# Patient Record
Sex: Female | Born: 1950
Health system: Southern US, Community
[De-identification: ages and names within clinical notes are randomized; demographics above are authoritative.]

## PROBLEM LIST (undated history)

## (undated) DIAGNOSIS — M5020 Other cervical disc displacement, unspecified cervical region: Secondary | ICD-10-CM

## (undated) DIAGNOSIS — F32A Depression, unspecified: Secondary | ICD-10-CM

## (undated) DIAGNOSIS — R112 Nausea with vomiting, unspecified: Secondary | ICD-10-CM

## (undated) DIAGNOSIS — M1990 Unspecified osteoarthritis, unspecified site: Secondary | ICD-10-CM

## (undated) DIAGNOSIS — Z9889 Other specified postprocedural states: Secondary | ICD-10-CM

## (undated) DIAGNOSIS — T4145XA Adverse effect of unspecified anesthetic, initial encounter: Secondary | ICD-10-CM

## (undated) DIAGNOSIS — M4802 Spinal stenosis, cervical region: Secondary | ICD-10-CM

## (undated) DIAGNOSIS — E079 Disorder of thyroid, unspecified: Secondary | ICD-10-CM

## (undated) DIAGNOSIS — T8859XA Other complications of anesthesia, initial encounter: Secondary | ICD-10-CM

## (undated) DIAGNOSIS — F419 Anxiety disorder, unspecified: Secondary | ICD-10-CM

## (undated) DIAGNOSIS — K219 Gastro-esophageal reflux disease without esophagitis: Secondary | ICD-10-CM

## (undated) DIAGNOSIS — F329 Major depressive disorder, single episode, unspecified: Secondary | ICD-10-CM

## (undated) HISTORY — DX: Disorder of thyroid, unspecified: E07.9

## (undated) HISTORY — DX: Depression, unspecified: F32.A

## (undated) HISTORY — DX: Major depressive disorder, single episode, unspecified: F32.9

## (undated) HISTORY — PX: FOOT SURGERY: SHX648

## (undated) HISTORY — PX: TUBAL LIGATION: SHX77

## (undated) HISTORY — DX: Anxiety disorder, unspecified: F41.9

## (undated) HISTORY — PX: BREAST CYST ASPIRATION: SHX578

## (undated) HISTORY — PX: CARPAL TUNNEL RELEASE: SHX101

---

## 2007-11-03 HISTORY — PX: BRAIN TUMOR EXCISION: SHX577

## 2012-08-11 DIAGNOSIS — Z79890 Hormone replacement therapy (postmenopausal): Secondary | ICD-10-CM | POA: Insufficient documentation

## 2015-06-12 DIAGNOSIS — M654 Radial styloid tenosynovitis [de Quervain]: Secondary | ICD-10-CM | POA: Insufficient documentation

## 2015-11-03 HISTORY — PX: BACK SURGERY: SHX140

## 2018-03-21 DIAGNOSIS — H43812 Vitreous degeneration, left eye: Secondary | ICD-10-CM | POA: Diagnosis not present

## 2018-03-21 DIAGNOSIS — H43393 Other vitreous opacities, bilateral: Secondary | ICD-10-CM | POA: Diagnosis not present

## 2018-03-21 DIAGNOSIS — H4312 Vitreous hemorrhage, left eye: Secondary | ICD-10-CM | POA: Diagnosis not present

## 2018-03-21 DIAGNOSIS — H47093 Other disorders of optic nerve, not elsewhere classified, bilateral: Secondary | ICD-10-CM | POA: Diagnosis not present

## 2018-03-21 DIAGNOSIS — H25813 Combined forms of age-related cataract, bilateral: Secondary | ICD-10-CM | POA: Diagnosis not present

## 2018-03-21 DIAGNOSIS — H2513 Age-related nuclear cataract, bilateral: Secondary | ICD-10-CM | POA: Diagnosis not present

## 2018-03-21 DIAGNOSIS — H43392 Other vitreous opacities, left eye: Secondary | ICD-10-CM | POA: Diagnosis not present

## 2018-03-21 DIAGNOSIS — H4423 Degenerative myopia, bilateral: Secondary | ICD-10-CM | POA: Diagnosis not present

## 2018-03-21 DIAGNOSIS — H25013 Cortical age-related cataract, bilateral: Secondary | ICD-10-CM | POA: Diagnosis not present

## 2018-03-21 DIAGNOSIS — H43813 Vitreous degeneration, bilateral: Secondary | ICD-10-CM | POA: Diagnosis not present

## 2018-03-24 ENCOUNTER — Encounter (INDEPENDENT_AMBULATORY_CARE_PROVIDER_SITE_OTHER): Payer: Self-pay | Admitting: Orthopaedic Surgery

## 2018-03-24 ENCOUNTER — Ambulatory Visit (INDEPENDENT_AMBULATORY_CARE_PROVIDER_SITE_OTHER): Payer: Medicare Other | Admitting: Orthopaedic Surgery

## 2018-03-24 VITALS — BP 141/74 | HR 104 | Ht 63.0 in | Wt 170.0 lb

## 2018-03-24 DIAGNOSIS — M47812 Spondylosis without myelopathy or radiculopathy, cervical region: Secondary | ICD-10-CM

## 2018-03-24 DIAGNOSIS — M7542 Impingement syndrome of left shoulder: Secondary | ICD-10-CM | POA: Diagnosis not present

## 2018-03-24 DIAGNOSIS — Z9889 Other specified postprocedural states: Secondary | ICD-10-CM | POA: Diagnosis not present

## 2018-03-24 NOTE — Progress Notes (Signed)
Office Visit Note   Patient: Elizabeth Hull           Date of Birth: Feb 03, 1951           MRN: [id number] Visit Date: 03/24/2018              Requested by: No referring provider defined for this encounter. PCP: No primary care provider on file.   Assessment & Plan: Visit Diagnoses:  1. Impingement syndrome of left shoulder   2. Spondylosis without myelopathy or radiculopathy, cervical region   3. S/P lumbar microdiscectomy     Plan: Left subacromial injection performed with improvement in pain.  She like to come back next week to consider injection right shoulder.  Will obtain 2 view x-rays right and left shoulder on return visit.  Follow-Up Instructions: Return in about 1 week (around 03/31/2018).   Orders:  Orders Placed This Encounter  Procedures  . Large Joint Inj: L subacromial bursa   No orders of the defined types were placed in this encounter.     Procedures: Large Joint Inj: L subacromial bursa on 03/24/2018 3:35 PM Indications: pain Details: 22 G 1.5 in needle  Arthrogram: No  Medications: 4 mL bupivacaine 0.25 %; 40 mg methylPREDNISolone acetate 40 MG/ML; 0.5 mL lidocaine 1 % Outcome: tolerated well, no immediate complications Procedure, treatment alternatives, risks and benefits explained, specific risks discussed. Consent was given by the patient. Immediately prior to procedure a time out was called to verify the correct patient, procedure, equipment, support staff and site/side marked as required. Patient was prepped and draped in the usual sterile fashion.       Clinical Data: No additional findings.   Subjective: Chief Complaint  Patient presents with  . Neck - Pain    HPI 67 year old female first visit with bilateral shoulder pain.  She can reach behind her back with either arm she cannot sleep on either side.  She is at history of lumbar surgery 2017 has known disc degeneration in the cervical spine.  She is had pain for years and states is been  worse in the last 2 months.  She is been treated with gabapentin Aleve, icy hot, ice packs and heating pad all without relief.  She does have a history of benign brain tumor the past.  Was treated at [facility] in [place] with her previous lumbar surgery.  Brain tumor surgery 2009.  Cervical MRI scan done at [facility] showed multilevel cervical degenerative changes with neuroforaminal narrowing at C4-5 worse on the right.  Disc osteophyte mild right and mild to moderate left at C5-6.  Disc osteophyte complex severe to the left moderate to severe left and mild right neuroforaminal narrowing.  Incidental note is right temporal lobe encephalomalacia visualized on sagittal image likely related to previous brain surgery.  Plain radiographs showed multilevel degenerative changes from C4-C7.   Review of Systems positive for anxiety ,depression ,thyroid condition.  Previous benign brain tumor resection 2009 lumbar surgery 2017.   Objective: Vital Signs: BP (!) 141/74   Pulse (!) 104   Ht  (1.6 m)   Wt 170 lb (77.1 kg)   BMI 30.11 kg/m   Physical Exam  Constitutional: She is oriented to person, place, and time. She appears well-developed.  HENT:  Head: Normocephalic.  Right Ear: External ear normal.  Left Ear: External ear normal.  Eyes: Pupils are equal, round, and reactive to light.  Neck: No tracheal deviation present. No thyromegaly present.  Cardiovascular: Normal  rate.  Pulmonary/Chest: Effort normal.  Abdominal: Soft.  Neurological: She is alert and oriented to person, place, and time.  Skin: Skin is warm and dry.  Psychiatric: She has a normal mood and affect. Her behavior is normal.  Healed craniotomy scar.  Ortho Exam healed lumbar incision.  Bilateral shoulder impingement .mild to moderate brachial plexus tenderness right and left.  Upper extremity reflexes are 2+ and symmetrical.  No wrist extension and flexion biceps triceps weakness.  Specialty  Comments:  No specialty comments available.  Imaging: No results found.   PMFS History: Patient Active Problem List   Diagnosis Date Noted  . Spondylosis without myelopathy or radiculopathy, cervical region 03/29/2018   Past Medical History:  Diagnosis Date  . Anxiety   . Depression   . Thyroid condition     No family history on file.  Past Surgical History:  Procedure Laterality Date  . BACK SURGERY  2017   lumbar  . BRAIN TUMOR EXCISION     Social History   Occupational History  . Not on file  Tobacco Use  . Smoking status: Never Smoker  . Smokeless tobacco: Never Used  Substance and Sexual Activity  . Alcohol use: Yes    Comment: wine with dinner  . Drug use: Never  . Sexual activity: Not on file

## 2018-03-29 ENCOUNTER — Encounter (INDEPENDENT_AMBULATORY_CARE_PROVIDER_SITE_OTHER): Payer: Self-pay | Admitting: Orthopaedic Surgery

## 2018-03-29 DIAGNOSIS — M47812 Spondylosis without myelopathy or radiculopathy, cervical region: Secondary | ICD-10-CM | POA: Insufficient documentation

## 2018-03-29 DIAGNOSIS — Z9889 Other specified postprocedural states: Secondary | ICD-10-CM | POA: Insufficient documentation

## 2018-03-29 DIAGNOSIS — M5030 Other cervical disc degeneration, unspecified cervical region: Secondary | ICD-10-CM | POA: Insufficient documentation

## 2018-03-29 DIAGNOSIS — M7542 Impingement syndrome of left shoulder: Secondary | ICD-10-CM | POA: Insufficient documentation

## 2018-03-29 MED ORDER — METHYLPREDNISOLONE ACETATE 40 MG/ML IJ SUSP
40.0000 mg | INTRAMUSCULAR | Status: AC | PRN
Start: 2018-03-24 — End: 2018-03-24
  Administered 2018-03-24: 40 mg via INTRA_ARTICULAR

## 2018-03-29 MED ORDER — BUPIVACAINE HCL 0.25 % IJ SOLN
4.0000 mL | INTRAMUSCULAR | Status: AC | PRN
Start: 2018-03-24 — End: 2018-03-24
  Administered 2018-03-24: 4 mL via INTRA_ARTICULAR

## 2018-03-29 MED ORDER — LIDOCAINE HCL 1 % IJ SOLN
0.5000 mL | INTRAMUSCULAR | Status: AC | PRN
  Administered 2018-03-24: .5 mL

## 2018-04-04 DIAGNOSIS — H43393 Other vitreous opacities, bilateral: Secondary | ICD-10-CM | POA: Diagnosis not present

## 2018-04-04 DIAGNOSIS — H4423 Degenerative myopia, bilateral: Secondary | ICD-10-CM | POA: Diagnosis not present

## 2018-04-04 DIAGNOSIS — H4312 Vitreous hemorrhage, left eye: Secondary | ICD-10-CM | POA: Diagnosis not present

## 2018-04-04 DIAGNOSIS — H43813 Vitreous degeneration, bilateral: Secondary | ICD-10-CM | POA: Diagnosis not present

## 2018-04-07 ENCOUNTER — Ambulatory Visit (INDEPENDENT_AMBULATORY_CARE_PROVIDER_SITE_OTHER): Payer: Medicare Other | Admitting: Orthopaedic Surgery

## 2018-04-13 DIAGNOSIS — Z6829 Body mass index (BMI) 29.0-29.9, adult: Secondary | ICD-10-CM | POA: Diagnosis not present

## 2018-04-13 DIAGNOSIS — N951 Menopausal and female climacteric states: Secondary | ICD-10-CM | POA: Diagnosis not present

## 2018-04-21 ENCOUNTER — Ambulatory Visit (INDEPENDENT_AMBULATORY_CARE_PROVIDER_SITE_OTHER): Payer: Medicare Other | Admitting: Orthopaedic Surgery

## 2018-04-21 ENCOUNTER — Ambulatory Visit (INDEPENDENT_AMBULATORY_CARE_PROVIDER_SITE_OTHER): Payer: Medicare Other

## 2018-04-21 ENCOUNTER — Ambulatory Visit (INDEPENDENT_AMBULATORY_CARE_PROVIDER_SITE_OTHER): Payer: Self-pay

## 2018-04-21 ENCOUNTER — Encounter (INDEPENDENT_AMBULATORY_CARE_PROVIDER_SITE_OTHER): Payer: Self-pay | Admitting: Orthopaedic Surgery

## 2018-04-21 VITALS — BP 140/83 | HR 97 | Ht 63.0 in | Wt 165.0 lb

## 2018-04-21 DIAGNOSIS — M25511 Pain in right shoulder: Secondary | ICD-10-CM

## 2018-04-21 DIAGNOSIS — G8929 Other chronic pain: Secondary | ICD-10-CM | POA: Diagnosis not present

## 2018-04-21 DIAGNOSIS — M25512 Pain in left shoulder: Secondary | ICD-10-CM

## 2018-04-21 DIAGNOSIS — M47812 Spondylosis without myelopathy or radiculopathy, cervical region: Secondary | ICD-10-CM | POA: Diagnosis not present

## 2018-04-21 DIAGNOSIS — M19012 Primary osteoarthritis, left shoulder: Secondary | ICD-10-CM

## 2018-04-21 DIAGNOSIS — M19011 Primary osteoarthritis, right shoulder: Secondary | ICD-10-CM

## 2018-04-21 NOTE — Progress Notes (Signed)
Office Visit Note   Patient: Elizabeth Hull           Date of Birth: Oct 21, 1951           MRN: [id number] Visit Date: 04/21/2018              Requested by: No referring provider defined for this encounter. PCP: No primary care provider on file.   Assessment & Plan: Visit Diagnoses:  1. Spondylosis without myelopathy or radiculopathy, cervical region   2. Chronic pain of both shoulders     Plan: Patient's cervical spondylosis is giving her the most problems.  Will obtain a new cervical MRI scan for comparison to the MRI 2 years ago aggressive neck and arm symptoms.  Office follow-up after scan for review.  We reviewed her previous images from 2 years ago which showed the retrolisthesis and significant narrowing.  Follow-Up Instructions: No follow-ups on file.   Orders:  Orders Placed This Encounter  Procedures  . XR Cervical Spine 2 or 3 views  . XR Shoulder Right  . XR Shoulder Left   No orders of the defined types were placed in this encounter.     Procedures: No procedures performed   Clinical Data: No additional findings.   Subjective: Chief Complaint  Patient presents with  . Neck - Pain, Follow-up  . Right Shoulder - Pain, Follow-up  . Left Shoulder - Pain, Follow-up    HPI 67-year-old female returns with ongoing problems with chronic neck pain.  Previous MRI 2 years ago showed severe spondylosis C4-5, C5-6, C6-7.  She has retrolisthesis at C4-5 and C5-6 on plain radiographs and significant uncovertebral and facet arthropathy.  Previous lumbar microdiscectomy is doing well.  She still has some mild back aching.  She has chronic pain in both shoulders with inability to reach past the posterior axillary line and subacromial injection right shoulder on 03/24/2018 did not give her any relief.  She gets some improvement with ibuprofen.  She is had significant [place] pain that radiates into her shoulders pain with rotation  extension and flexion of her neck.  Review of Systems positive for previous microdiscectomy.  Bilateral shoulder moderate osteoarthritis.  Right paralumbar approach likely at the L4-5 level for her microdiscectomy 2017.   Objective: Vital Signs: BP 140/83   Pulse 97   Ht 5\' 3"  (1.6 m)   Wt 165 lb (74.8 kg)   BMI 29.23 kg/m   Physical Exam  Constitutional: She is oriented to person, place, and time. She appears well-developed.  HENT:  Head: Normocephalic.  Right Ear: External ear normal.  Left Ear: External ear normal.  Eyes: Pupils are equal, round, and reactive to light.  Neck: No tracheal deviation present. No thyromegaly present.  Cardiovascular: Normal rate.  Pulmonary/Chest: Effort normal.  Abdominal: Soft.  Neurological: She is alert and oriented to person, place, and time.  Skin: Skin is warm and dry.  Psychiatric: She has a normal mood and affect. Her behavior is normal.    Ortho Exam lateral brachial plexus tenderness 50% flexion extension with reproduction of her pain with extension.  Negative drop arm test positive impingement limitation of internal rotation with hand to the mid to posterior axillary line right and left.  No supraclavicular lymphadenopathy.  Positive brachial plexus tenderness positive Spurling right and left.  Lower extremity reflexes are intact normal heel  heel toe gait.  Specialty Comments:  No specialty comments available.  Imaging: No results  found.   PMFS History: Patient Active Problem List   Diagnosis Date Noted  . Spondylosis without myelopathy or radiculopathy, cervical region 03/29/2018  . S/P lumbar microdiscectomy 03/29/2018  . Impingement syndrome of left shoulder 03/29/2018   Past Medical History:  Diagnosis Date  . Anxiety   . Depression   . Thyroid condition     No family history on file.  Past Surgical History:  Procedure Laterality Date  . BACK SURGERY  2017   lumbar  . BRAIN TUMOR EXCISION     Social History    Occupational History  . Not on file  Tobacco Use  . Smoking status: Never Smoker  . Smokeless tobacco: Never Used  Substance and Sexual Activity  . Alcohol use: Yes    Comment: wine with dinner  . Drug use: Never  . Sexual activity: Not on file

## 2018-04-25 DIAGNOSIS — M4802 Spinal stenosis, cervical region: Secondary | ICD-10-CM | POA: Diagnosis not present

## 2018-04-25 DIAGNOSIS — M4722 Other spondylosis with radiculopathy, cervical region: Secondary | ICD-10-CM | POA: Diagnosis not present

## 2018-04-25 DIAGNOSIS — M542 Cervicalgia: Secondary | ICD-10-CM | POA: Diagnosis not present

## 2018-04-27 ENCOUNTER — Encounter (INDEPENDENT_AMBULATORY_CARE_PROVIDER_SITE_OTHER): Payer: Self-pay | Admitting: Orthopaedic Surgery

## 2018-04-27 DIAGNOSIS — M19011 Primary osteoarthritis, right shoulder: Secondary | ICD-10-CM | POA: Insufficient documentation

## 2018-04-28 ENCOUNTER — Ambulatory Visit (INDEPENDENT_AMBULATORY_CARE_PROVIDER_SITE_OTHER): Payer: Medicare Other | Admitting: Orthopaedic Surgery

## 2018-04-28 ENCOUNTER — Encounter (INDEPENDENT_AMBULATORY_CARE_PROVIDER_SITE_OTHER): Payer: Self-pay | Admitting: Orthopaedic Surgery

## 2018-04-28 VITALS — BP 137/80 | HR 98 | Ht 63.0 in | Wt 165.0 lb

## 2018-04-28 DIAGNOSIS — M19011 Primary osteoarthritis, right shoulder: Secondary | ICD-10-CM | POA: Diagnosis not present

## 2018-04-28 DIAGNOSIS — M47812 Spondylosis without myelopathy or radiculopathy, cervical region: Secondary | ICD-10-CM

## 2018-04-28 DIAGNOSIS — M2550 Pain in unspecified joint: Secondary | ICD-10-CM | POA: Diagnosis not present

## 2018-04-28 NOTE — Progress Notes (Signed)
Office Visit Note   Patient: Elizabeth Hull           Date of Birth: Mar 19, 1951           MRN: [id number] Visit Date: 04/28/2018              Requested by: No referring provider defined for this encounter. PCP: No primary care provider on file.   Assessment & Plan: Visit Diagnoses:  1. Multiple joint pain   2. Spondylosis without myelopathy or radiculopathy, cervical region   3. Primary osteoarthritis, right shoulder     Plan: We will obtain an arthritis panel.  Intra-articular right posterior shoulder injection performed which she tolerated well.  We will check her back in 2 weeks for follow-up.  We reviewed the MRI scan today I gave her a copy of the report she understands that she has multilevel involvement some on the right and some on the left.  In her cervical spine the left foramina at C3-4 and right foramen at C4-5 are most significant levels.  Mild central narrowing at 5 6 and C6-7 but no severe stenosis at those levels.  Follow-Up Instructions: Return in about 2 weeks (around 05/12/2018).   Orders:  Orders Placed This Encounter  Procedures  . CBC with Differential  . Uric acid  . Sed Rate (ESR)  . Antinuclear Antib (ANA)  . Rheumatoid Factor   No orders of the defined types were placed in this encounter.     Procedures: Large Joint Inj: R glenohumeral on 04/29/2018 5:04 PM Indications: pain Details: 22 G 1.5 in needle  Arthrogram: No  Medications: 4 mL bupivacaine 0.25 %; 40 mg methylPREDNISolone acetate 40 MG/ML; 0.5 mL lidocaine 1 % Outcome: tolerated well, no immediate complications Procedure, treatment alternatives, risks and benefits explained, specific risks discussed. Consent was given by the patient. Immediately prior to procedure a time out was called to verify the correct patient, procedure, equipment, support staff and site/side marked as required. Patient was prepped and draped in the usual sterile fashion.       Clinical Data: No additional  findings.   Subjective: Chief Complaint  Patient presents with  . Neck - Pain, Follow-up    MRI C-spine review    HPI 67 year old female returns for follow-up of cervical spondylosis as well as osteoarthritis of the shoulders.  Cervical MRI scan has been obtained for review and comparison to previous MRI 2 years ago.  Next been giving her more problems.  Previous lumbar microdiscectomy doing well.  He has problems reaching behind her posterior axillary line with both the right and left shoulder.  No relief with 03/24/2018 subacromial injection right shoulder.  Both shoulders show moderate osteoarthritis.  Review of Systems 14 point review of systems updated unchanged from last week office visit 04/21/2018.  Of note his depression, anxiety, thyroid condition, benign brain tumor resection 2009, lumbar microdiscectomy 2017.   Objective: Vital Signs: BP 137/80   Pulse 98   Ht _0  (1.6 m)   Wt 165 lb (74.8 kg)   BMI 29.23 kg/m   Physical Exam  Constitutional: She is oriented to person, place, and time. She appears well-developed.  HENT:  Head: Normocephalic.  Right Ear: External ear normal.  Left Ear: External ear normal.  Eyes: Pupils are equal, round, and reactive to light.  Neck: No tracheal deviation present. No thyromegaly present.  Cardiovascular: Normal rate.  Pulmonary/Chest: Effort normal.  Abdominal: Soft.  Neurological: She is alert and oriented to person, place,  and time.  Skin: Skin is warm and dry.  Psychiatric: She has a normal mood and affect. Her behavior is normal.    Ortho Exam patient has increased pain with cervical compression mild improvement with distraction.  Positive impingement both shoulders.  Limitation of range of motion both shoulders unchanged from 04/21/2018 visit.  Positive brachial plexus tenderness.  Specialty Comments:  No specialty comments available.  Imaging: Cervical MRI scan 04/25/2018 shows moderate to severe left foraminal stenosis at  C3-4.  C4-5 shows mild central stenosis and severe right foraminal stenosis due to large osteophyte.  Mild central stenosis at C5-6 and mild central stenosis at C6-7 with moderate foraminal stenosis greater on the right than the left with spurring.   PMFS History: Patient Active Problem List   Diagnosis Date Noted  . Primary osteoarthritis, right shoulder 04/27/2018  . Spondylosis without myelopathy or radiculopathy, cervical region 03/29/2018  . S/P lumbar microdiscectomy 03/29/2018  . Impingement syndrome of left shoulder 03/29/2018   Past Medical History:  Diagnosis Date  . Anxiety   . Depression   . Thyroid condition     History reviewed. No pertinent family history.  Past Surgical History:  Procedure Laterality Date  . BACK SURGERY  2017   lumbar  . BRAIN TUMOR EXCISION     Social History   Occupational History  . Not on file  Tobacco Use  . Smoking status: Never Smoker  . Smokeless tobacco: Never Used  Substance and Sexual Activity  . Alcohol use: Yes    Comment: wine with dinner  . Drug use: Never  . Sexual activity: Not on file

## 2018-04-29 ENCOUNTER — Encounter (INDEPENDENT_AMBULATORY_CARE_PROVIDER_SITE_OTHER): Payer: Self-pay | Admitting: Orthopaedic Surgery

## 2018-04-29 DIAGNOSIS — M19011 Primary osteoarthritis, right shoulder: Secondary | ICD-10-CM

## 2018-04-29 DIAGNOSIS — M2550 Pain in unspecified joint: Secondary | ICD-10-CM | POA: Diagnosis not present

## 2018-04-29 MED ORDER — BUPIVACAINE HCL 0.25 % IJ SOLN
4.0000 mL | INTRAMUSCULAR | Status: AC | PRN
  Administered 2018-04-29: 4 mL via INTRA_ARTICULAR

## 2018-04-29 MED ORDER — METHYLPREDNISOLONE ACETATE 40 MG/ML IJ SUSP
40.0000 mg | INTRAMUSCULAR | Status: AC | PRN
  Administered 2018-04-29: 40 mg via INTRA_ARTICULAR

## 2018-04-29 MED ORDER — LIDOCAINE HCL 1 % IJ SOLN
0.5000 mL | INTRAMUSCULAR | Status: AC | PRN
  Administered 2018-04-29: .5 mL

## 2018-05-12 ENCOUNTER — Ambulatory Visit (INDEPENDENT_AMBULATORY_CARE_PROVIDER_SITE_OTHER): Payer: Medicare Other | Admitting: Orthopaedic Surgery

## 2018-05-12 ENCOUNTER — Encounter (INDEPENDENT_AMBULATORY_CARE_PROVIDER_SITE_OTHER): Payer: Self-pay | Admitting: Orthopaedic Surgery

## 2018-05-12 VITALS — BP 145/83 | HR 105 | Ht 63.0 in | Wt 165.0 lb

## 2018-05-12 DIAGNOSIS — M19011 Primary osteoarthritis, right shoulder: Secondary | ICD-10-CM | POA: Diagnosis not present

## 2018-05-12 DIAGNOSIS — M47812 Spondylosis without myelopathy or radiculopathy, cervical region: Secondary | ICD-10-CM | POA: Diagnosis not present

## 2018-05-12 NOTE — Progress Notes (Signed)
Office Visit Note   Patient: Elizabeth Hull           Date of Birth: July 18, 1951           MRN: [id number] Visit Date: 05/12/2018              Requested by: No referring provider defined for this encounter. PCP: Patient, No Pcp Per   Assessment & Plan: Visit Diagnoses:  1. Spondylosis without myelopathy or radiculopathy, cervical region   2. Primary osteoarthritis, right shoulder     Plan: We reviewed the MRI in comparison to the 2 previous MRIs of her cervical spine.  She has multilevel involvement in the cervical spine.  Her primary pain generator appears to be cervical spine and not her shoulder osteoarthritis.  She has been on Neurontin as well as amitriptyline.  She takes trazodone at night.  MRI was reviewed with patient we discussed surgical options.  Plan to recheck her again in 6 weeks.  Follow-Up Instructions: Return in about 6 weeks (around 06/23/2018).   Orders:  No orders of the defined types were placed in this encounter.  No orders of the defined types were placed in this encounter.     Procedures: No procedures performed   Clinical Data: No additional findings.   Subjective: Chief Complaint  Patient presents with  . Neck - Pain, Follow-up  . Right Shoulder - Pain, Follow-up  . Left Shoulder - Pain, Follow-up    HPI 67 year old female returns for follow-up of bilateral shoulder pain as well as cervical spondylosis.  Previous posterior intra-articular shoulder injection done on the right shoulder 04/28/2018 and states she really did not notice any significant change with this injection.  MRI scan 04/25/2018 showed moderate to severe left foraminal stenosis at C3-4.  C4-5 mild central stenosis and severe right foraminal stenosis due to large osteophyte.  Mild central stenosis at C5-6 and mild central stenosis at C6-7 with moderate foraminal stenosis greater on the right than left with spurring.  Shoulder x-rays showed primary osteoarthritis of her shoulder.  Lab  work reviewed with patient shows negative ANA.  Uric acid was 3.8.  WBC was 11.6 with normal differential.  Sed rate was 29 in normal range.  Review of Systems 14 point review of systems updated unchanged from 04/21/2018 office visit.  She did have a benign brain tumor resection, lumbar microdiscectomy 2017.  Positive for depression anxiety and thyroid condition.   Objective: Vital Signs: BP (!) 145/83   Pulse (!) 105   Ht 5\' 3"  (1.6 m)   Wt 165 lb (74.8 kg)   BMI 29.23 kg/m   Physical Exam  Constitutional: She is oriented to person, place, and time. She appears well-developed.  HENT:  Head: Normocephalic.  Right Ear: External ear normal.  Left Ear: External ear normal.  Eyes: Pupils are equal, round, and reactive to light.  Neck: No tracheal deviation present. No thyromegaly present.  Cardiovascular: Normal rate.  Pulmonary/Chest: Effort normal.  Abdominal: Soft.  Neurological: She is alert and oriented to person, place, and time.  Skin: Skin is warm and dry.  Psychiatric: She has a normal mood and affect. Her behavior is normal.    Ortho Exam patient has increased pain with cervical compression some mild improvement with distraction.  Positive impingement both shoulders with limited range of motion unchanged from 620/2019 visit.  Right and left positive brachial plexus tenderness. Specialty Comments:  No specialty comments available.  Imaging: No results found.   PMFS History: Patient  Active Problem List   Diagnosis Date Noted  . Primary osteoarthritis, right shoulder 04/27/2018  . Spondylosis without myelopathy or radiculopathy, cervical region 03/29/2018  . S/P lumbar microdiscectomy 03/29/2018  . Impingement syndrome of left shoulder 03/29/2018   Past Medical History:  Diagnosis Date  . Anxiety   . Depression   . Thyroid condition     No family history on file.  Past Surgical History:  Procedure Laterality Date  . BACK SURGERY  2017   lumbar  . BRAIN TUMOR  EXCISION     Social History   Occupational History  . Not on file  Tobacco Use  . Smoking status: Never Smoker  . Smokeless tobacco: Never Used  Substance and Sexual Activity  . Alcohol use: Yes    Comment: wine with dinner  . Drug use: Never  . Sexual activity: Not on file

## 2018-05-17 ENCOUNTER — Encounter (INDEPENDENT_AMBULATORY_CARE_PROVIDER_SITE_OTHER): Payer: Self-pay | Admitting: Orthopaedic Surgery

## 2018-05-23 DIAGNOSIS — H25813 Combined forms of age-related cataract, bilateral: Secondary | ICD-10-CM | POA: Diagnosis not present

## 2018-05-23 DIAGNOSIS — H25013 Cortical age-related cataract, bilateral: Secondary | ICD-10-CM | POA: Diagnosis not present

## 2018-05-23 DIAGNOSIS — H43812 Vitreous degeneration, left eye: Secondary | ICD-10-CM | POA: Diagnosis not present

## 2018-05-23 DIAGNOSIS — H25041 Posterior subcapsular polar age-related cataract, right eye: Secondary | ICD-10-CM | POA: Diagnosis not present

## 2018-05-23 DIAGNOSIS — H47393 Other disorders of optic disc, bilateral: Secondary | ICD-10-CM | POA: Diagnosis not present

## 2018-05-23 DIAGNOSIS — H524 Presbyopia: Secondary | ICD-10-CM | POA: Diagnosis not present

## 2018-05-23 DIAGNOSIS — H43811 Vitreous degeneration, right eye: Secondary | ICD-10-CM | POA: Diagnosis not present

## 2018-05-23 DIAGNOSIS — H2513 Age-related nuclear cataract, bilateral: Secondary | ICD-10-CM | POA: Diagnosis not present

## 2018-05-23 DIAGNOSIS — H43392 Other vitreous opacities, left eye: Secondary | ICD-10-CM | POA: Diagnosis not present

## 2018-05-30 DIAGNOSIS — Z6830 Body mass index (BMI) 30.0-30.9, adult: Secondary | ICD-10-CM | POA: Diagnosis not present

## 2018-05-30 DIAGNOSIS — N951 Menopausal and female climacteric states: Secondary | ICD-10-CM | POA: Diagnosis not present

## 2018-06-30 ENCOUNTER — Encounter (INDEPENDENT_AMBULATORY_CARE_PROVIDER_SITE_OTHER): Payer: Self-pay | Admitting: Orthopaedic Surgery

## 2018-06-30 ENCOUNTER — Ambulatory Visit (INDEPENDENT_AMBULATORY_CARE_PROVIDER_SITE_OTHER): Payer: Medicare Other | Admitting: Orthopaedic Surgery

## 2018-06-30 VITALS — BP 128/81 | HR 103 | Ht 63.0 in | Wt 170.0 lb

## 2018-06-30 DIAGNOSIS — M47812 Spondylosis without myelopathy or radiculopathy, cervical region: Secondary | ICD-10-CM | POA: Diagnosis not present

## 2018-06-30 DIAGNOSIS — M19011 Primary osteoarthritis, right shoulder: Secondary | ICD-10-CM | POA: Diagnosis not present

## 2018-06-30 NOTE — Progress Notes (Signed)
Office Visit Note   Patient: Elizabeth Hull           Date of Birth: 06/12/1951           MRN: [id number] Visit Date: 06/30/2018              Requested by: No referring provider defined for this encounter. PCP: Patient, No Pcp Per   Assessment & Plan: Visit Diagnoses:  1. Spondylosis without myelopathy or radiculopathy, cervical region   2. Primary osteoarthritis, right shoulder     Plan: We reviewed the images with the patient and her husband cervical spine MRI.  She continues to have neck and right shoulder pain.  We will set her up for single cervical epidural steroid see her back after the injection.  Follow-Up Instructions: Return in about 4 weeks (around 07/28/2018).   Orders:  No orders of the defined types were placed in this encounter.  No orders of the defined types were placed in this encounter.     Procedures: No procedures performed   Clinical Data: No additional findings.   Subjective: Chief Complaint  Patient presents with  . Neck - Pain, Follow-up    HPI 67 year old female returns and is here with her husband.  She continues to have neck pain right shoulder pain.  She has moderate to severe shoulder arthritis but can still get her arm up overhead.  She modifies her activity with her shoulder.  She has pain with rotation of her neck and pain that radiates more in the right shoulder than left radiates down to the elbow and stops is not associated with shoulder range of motion.  She has associated numbness worse on the right than left side.  Intra-articular injection in her shoulder really did not give her any improvement.  She is been on amitriptyline, trazodone, Neurontin, therapy.  Rheumatoid panel was negative. Her neck pain bothers her on a daily basis.  She has trouble falling asleep at night due to her neck pain.  C4-5 is her most severe level with mild central stenosis and severe right foraminal stenosis. Review of Systems 14 point review of systems  updated unchanged from 05/12/2018 office visit other than as mentioned above.  Objective: Vital Signs: BP 128/81   Pulse (!) 103   Ht 5\' 3"  (1.6 m)   Wt 170 lb (77.1 kg)   BMI 30.11 kg/m   Physical Exam  Constitutional: She is oriented to person, place, and time. She appears well-developed.  HENT:  Head: Normocephalic.  Right Ear: External ear normal.  Left Ear: External ear normal.  Eyes: Pupils are equal, round, and reactive to light.  Neck: No tracheal deviation present. No thyromegaly present.  Cardiovascular: Normal rate.  Pulmonary/Chest: Effort normal.  Abdominal: Soft.  Neurological: She is alert and oriented to person, place, and time.  Skin: Skin is warm and dry.  Psychiatric: She has a normal mood and affect. Her behavior is normal.    Ortho Exam patient has pain and crepitus with right shoulder range of motion but is able to get her arm up overhead can reach back to the posterior axillary line.  Mild positive impingement both right and left shoulder.  She has significant right brachial plexus tenderness positive Spurling on the right negative on the left.  No lower extremity clonus normal heel toe gait.  Specialty Comments:  No specialty comments available.  Imaging: Cervical MRI scan 04/25/2018 read by Dr. Marlan Palauharles Clark impression is moderate to severe foraminal encroachment C3-4.  Mild spinal stenosis C4-5 with severe right foraminal encroachment due to large osteophyte.  Mild spinal stenosis C5-6.  Mild spinal stenosis C6-7 left greater than right due to spurring.   PMFS History: Patient Active Problem List   Diagnosis Date Noted  . Primary osteoarthritis, right shoulder 04/27/2018  . Spondylosis without myelopathy or radiculopathy, cervical region 03/29/2018  . S/P lumbar microdiscectomy 03/29/2018  . Impingement syndrome of left shoulder 03/29/2018   Past Medical History:  Diagnosis Date  . Anxiety   . Depression   . Thyroid condition     History  reviewed. No pertinent family history.  Past Surgical History:  Procedure Laterality Date  . BACK SURGERY  2017   lumbar  . BRAIN TUMOR EXCISION     Social History   Occupational History  . Not on file  Tobacco Use  . Smoking status: Never Smoker  . Smokeless tobacco: Never Used  Substance and Sexual Activity  . Alcohol use: Yes    Comment: wine with dinner  . Drug use: Never  . Sexual activity: Not on file

## 2018-07-01 ENCOUNTER — Encounter (INDEPENDENT_AMBULATORY_CARE_PROVIDER_SITE_OTHER): Payer: Self-pay | Admitting: Orthopaedic Surgery

## 2018-07-22 ENCOUNTER — Ambulatory Visit (INDEPENDENT_AMBULATORY_CARE_PROVIDER_SITE_OTHER): Payer: Medicare Other | Admitting: Physical Medicine and Rehabilitation

## 2018-07-22 ENCOUNTER — Encounter (INDEPENDENT_AMBULATORY_CARE_PROVIDER_SITE_OTHER): Payer: Self-pay | Admitting: Physical Medicine and Rehabilitation

## 2018-07-22 ENCOUNTER — Ambulatory Visit (INDEPENDENT_AMBULATORY_CARE_PROVIDER_SITE_OTHER): Payer: Self-pay

## 2018-07-22 VITALS — BP 142/85 | HR 54 | Temp 98.2°F

## 2018-07-22 DIAGNOSIS — M542 Cervicalgia: Secondary | ICD-10-CM | POA: Diagnosis not present

## 2018-07-22 DIAGNOSIS — M5412 Radiculopathy, cervical region: Secondary | ICD-10-CM

## 2018-07-22 MED ORDER — METHYLPREDNISOLONE ACETATE 80 MG/ML IJ SUSP
80.0000 mg | Freq: Once | INTRAMUSCULAR | Status: AC
  Administered 2018-07-22: 80 mg

## 2018-07-22 NOTE — Patient Instructions (Signed)

## 2018-07-22 NOTE — Progress Notes (Signed)
 .  Numeric Pain Rating Scale and Functional Assessment Average Pain 5   In the last MONTH (on 0-10 scale) has pain interfered with the following?  1. General activity like being  able to carry out your everyday physical activities such as walking, climbing stairs, carrying groceries, or moving a chair?  Rating(4)   +Driver, -BT, -Dye Allergies.  

## 2018-07-25 DIAGNOSIS — Z299 Encounter for prophylactic measures, unspecified: Secondary | ICD-10-CM | POA: Diagnosis not present

## 2018-07-25 DIAGNOSIS — R232 Flushing: Secondary | ICD-10-CM | POA: Diagnosis not present

## 2018-07-25 DIAGNOSIS — Z23 Encounter for immunization: Secondary | ICD-10-CM | POA: Diagnosis not present

## 2018-07-25 DIAGNOSIS — Z6830 Body mass index (BMI) 30.0-30.9, adult: Secondary | ICD-10-CM | POA: Diagnosis not present

## 2018-07-25 DIAGNOSIS — G4700 Insomnia, unspecified: Secondary | ICD-10-CM | POA: Diagnosis not present

## 2018-07-25 DIAGNOSIS — F419 Anxiety disorder, unspecified: Secondary | ICD-10-CM | POA: Diagnosis not present

## 2018-07-25 DIAGNOSIS — Z789 Other specified health status: Secondary | ICD-10-CM | POA: Diagnosis not present

## 2018-07-25 DIAGNOSIS — E039 Hypothyroidism, unspecified: Secondary | ICD-10-CM | POA: Diagnosis not present

## 2018-08-01 NOTE — Procedures (Signed)
Cervical Epidural Steroid Injection - Interlaminar Approach with Fluoroscopic Guidance  Patient: Elizabeth Hull      Date of Birth: [birth year] MRN: [id number] PCP: Patient, No Pcp Per      Visit Date: 07/22/2018   Universal Protocol:    Date/Time: 09/30/196:08 AM  Consent Given By: the patient  Position: PRONE  Additional Comments: Vital signs were monitored before and after the procedure. Patient was prepped and draped in the usual sterile fashion. The correct patient, procedure, and site was verified.   Injection Procedure Details:  Procedure Site One Meds Administered:  Meds ordered this encounter  Medications  . methylPREDNISolone acetate (DEPO-MEDROL) injection 80 mg     Laterality: Right  Location/Site: C7-T1  Needle size: 20 G  Needle type: Touhy  Needle Placement: Paramedian epidural space  Findings:  -Comments: Excellent flow of contrast into the epidural space.  Procedure Details: Using a paramedian approach from the side mentioned above, the region overlying the inferior lamina was localized under fluoroscopic visualization and the soft tissues overlying this structure were infiltrated with 4 ml. of 1% Lidocaine without Epinephrine. A # 20 gauge, Tuohy needle was inserted into the epidural space using a paramedian approach.  The epidural space was localized using loss of resistance along with lateral and contralateral oblique bi-planar fluoroscopic views.  After negative aspirate for air, blood, and CSF, a 2 ml. volume of Isovue-250 was injected into the epidural space and the flow of contrast was observed. Radiographs were obtained for documentation purposes.   The injectate was administered into the level noted above.  Additional Comments:  The patient tolerated the procedure well Dressing: Band-Aid    Post-procedure details: Patient was observed during the procedure. Post-procedure instructions were reviewed.  Patient left the clinic in stable  condition.

## 2018-08-01 NOTE — Progress Notes (Signed)
   Elizabeth Hull - 67 y.o. female MRN [id number]  Date of birth: 22-May-1951  Office Visit Note: Visit Date: 07/22/2018 PCP: Patient, No Pcp Per Referred by: No ref. provider found  Subjective: Chief Complaint  Patient presents with  . Right Shoulder - Pain  . Left Shoulder - Pain  . Neck - Pain   HPI: Mrs. Elizabeth Hull is a 67 year old female that comes in today at the request of Dr. Annell Greening for cervical interlaminar epidural steroid injection.  She reports 8 months of chronic worsening severe neck pain that refers into both shoulders and arms.  She is really failed conservative care with medication management.  MRI was obtained and reviewed below.  She has not had prior cervical surgery.   ROS Otherwise per HPI.  Assessment & Plan: Visit Diagnoses:  1. Cervical radiculopathy   2. Cervicalgia     Plan: No additional findings.   Meds & Orders:  Meds ordered this encounter  Medications  . methylPREDNISolone acetate (DEPO-MEDROL) injection 80 mg    Orders Placed This Encounter  Procedures  . XR C-ARM NO REPORT  . Epidural Steroid injection    Follow-up: Return in about 2 weeks (around 08/05/2018) for Dr. Annell Greening, pt. to call.   Procedures: No procedures performed  Cervical Epidural Steroid Injection - Interlaminar Approach with Fluoroscopic Guidance  Patient: Elizabeth Hull      Date of Birth: 06/05/1951 MRN: [id number] PCP: Patient, No Pcp Per      Visit Date: 07/22/2018   Universal Protocol:    Date/Time: 09/30/196:08 AM  Consent Given By: the patient  Position: PRONE  Additional Comments: Vital signs were monitored before and after the procedure. Patient was prepped and draped in the usual sterile fashion. The correct patient, procedure, and site was verified.   Injection Procedure Details:  Procedure Site One Meds Administered:  Meds ordered this encounter  Medications  . methylPREDNISolone acetate (DEPO-MEDROL) injection 80 mg     Laterality:  Right  Location/Site: C7-T1  Needle size: 20 G  Needle type: Touhy  Needle Placement: Paramedian epidural space  Findings:  -Comments: Excellent flow of contrast into the epidural space.  Procedure Details: Using a paramedian approach from the side mentioned above, the region overlying the inferior lamina was localized under fluoroscopic visualization and the soft tissues overlying this structure were infiltrated with 4 ml. of 1% Lidocaine without Epinephrine. A # 20 gauge, Tuohy needle was inserted into the epidural space using a paramedian approach.  The epidural space was localized using loss of resistance along with lateral and contralateral oblique bi-planar fluoroscopic views.  After negative aspirate for air, blood, and CSF, a 2 ml. volume of Isovue-250 was injected into the epidural space and the flow of contrast was observed. Radiographs were obtained for documentation purposes.   The injectate was administered into the level noted above.  Additional Comments:  The patient tolerated the procedure well Dressing: Band-Aid    Post-procedure details: Patient was observed during the procedure. Post-procedure instructions were reviewed.  Patient left the clinic in stable condition.   Clinical History: 04/25/2018 Cspine MRI C2-3 and C3-4 severe right foraminal stenosis with small listhesis of C3 on C4 C4-5 severe right foraminal stenosis C5-6 mild central stensosis, uncovertebral     Objective:  VS:  HT:    WT:   BMI:     BP:(!) 142/85  HR:(!) 54bpm  TEMP:98.2 F (36.8 C)(Oral)  RESP:  Physical Exam  Ortho Exam Imaging: No results found.

## 2018-08-04 ENCOUNTER — Ambulatory Visit (INDEPENDENT_AMBULATORY_CARE_PROVIDER_SITE_OTHER): Payer: Medicare Other | Admitting: Orthopaedic Surgery

## 2018-08-11 ENCOUNTER — Ambulatory Visit (INDEPENDENT_AMBULATORY_CARE_PROVIDER_SITE_OTHER): Payer: Medicare Other | Admitting: Orthopaedic Surgery

## 2018-08-11 ENCOUNTER — Encounter (INDEPENDENT_AMBULATORY_CARE_PROVIDER_SITE_OTHER): Payer: Self-pay | Admitting: Orthopaedic Surgery

## 2018-08-11 VITALS — BP 147/85 | HR 103 | Ht 63.0 in | Wt 170.0 lb

## 2018-08-11 DIAGNOSIS — M9981 Other biomechanical lesions of cervical region: Secondary | ICD-10-CM

## 2018-08-11 DIAGNOSIS — M19012 Primary osteoarthritis, left shoulder: Secondary | ICD-10-CM

## 2018-08-11 DIAGNOSIS — M4802 Spinal stenosis, cervical region: Secondary | ICD-10-CM

## 2018-08-11 DIAGNOSIS — M19011 Primary osteoarthritis, right shoulder: Secondary | ICD-10-CM

## 2018-08-11 DIAGNOSIS — M47812 Spondylosis without myelopathy or radiculopathy, cervical region: Secondary | ICD-10-CM | POA: Diagnosis not present

## 2018-08-11 NOTE — Progress Notes (Signed)
Office Visit Note   Patient: Elizabeth Hull           Date of Birth: Mar 29, 1951           MRN: [id number] Visit Date: 08/11/2018              Requested by: No referring provider defined for this encounter. PCP: Patient, No Pcp Per   Assessment & Plan: Visit Diagnoses:  1. Spondylosis without myelopathy or radiculopathy, cervical region   2. Primary osteoarthritis of both shoulders   3. Neural foraminal stenosis of cervical spine     Plan: Patient's failed conservative treatment including anti-inflammatories exercise, muscle relaxants, amitriptyline, Neurontin, cervical epidural steroid injections with persistent severe right C4-5 foraminal stenosis with mild central stenosis.  She has some foraminal narrowing on the left but no left-sided symptoms.  Her pain is been severe.  She states she is ready for operative intervention and plan would be single level cervical fusion at C4-5 level for her severe foraminal stenosis due to large osteophyte complex with mild central stenosis.  Outpatient surgery with overnight stay, risk of dysphasia, dysphonia, cervical pseudoarthrosis, possible need for posterior fusion if she did not heal her fusion and had persistent symptoms which is unlikely.  Risks of anesthesia discussed.  Questions were elicited and answered she understands and requests we proceed.  Follow-Up Instructions: No follow-ups on file.   Orders:  No orders of the defined types were placed in this encounter.  No orders of the defined types were placed in this encounter.     Procedures: No procedures performed   Clinical Data: No additional findings.   Subjective: Chief Complaint  Patient presents with  . Neck - Follow-up    Post Cervical ESI    HPI 67 year old female returns with ongoing problems with neck pain and right arm pain.  She is worked on some range of motion exercises but still has problems with limitation of internal rotation of her right shoulder with her  hand just her posterior axillary line and she has bilateral glenohumeral arthritis with joint space narrowing and spurring.  Persistent neck pain with pain that radiates down to the right forearm and into the hand.  At times she states the neck pain is severe.  Husband is not with her today but she recorded our discussion so she complained for her husband who is at work.  Cervical epidural 07/22/2018 with relief for 1 week then recurrence of symptoms.  She denies left upper extremity symptoms.  No myelopathic symptoms.  No chills or fever.  She is been treated conservatively by me for 5 months.  Review of Systems 14 point review of systems positive for chronic neck pain right arm pain, C4-5 foraminal stenosis, anxiety, depression, thyroid condition.  Previous benign brain tumor resection 2009 without residual weakness.  Lumbar surgery 2017   Objective: Vital Signs: BP (!) 147/85   Pulse (!) 103   Ht 5\' 3"  (1.6 m)   Wt 170 lb (77.1 kg)   BMI 30.11 kg/m   Physical Exam  Constitutional: She is oriented to person, place, and time. She appears well-developed.  HENT:  Head: Normocephalic.  Right Ear: External ear normal.  Left Ear: External ear normal.  Eyes: Pupils are equal, round, and reactive to light.  Neck: No tracheal deviation present. No thyromegaly present.  Cardiovascular: Normal rate.  Pulmonary/Chest: Effort normal.  Abdominal: Soft.  Neurological: She is alert and oriented to person, place, and time.  Skin: Skin is warm  and dry.  Psychiatric: She has a normal mood and affect. Her behavior is normal.    Ortho Exam patient has brachial plexus tenderness on the right negative on the left positive Spurling on the right.  No supraclavicular lymphadenopathy.  Shoulder range of motion on the right is limited internal rotation with hand just posterior axillary line.  Opposite left shoulder she can reach across to the thoracic spine with her hand.  She can get both arms up overhead but  lacks 10 degrees full flexion and abduction on the right arm secondary to pain.  Mild positive impingement right shoulder negative drop arm test.  Upper extremity reflexes biceps triceps brachioradialis are 2+ and symmetrical no motor atrophy pulses are normal.  Normal heel toe gait no lower extremity hyperreflexia negative Babinski.  Well-healed lumbar incision.  Specialty Comments:  No specialty comments available.  Imaging: Cervical MRI scan 04/25/2018 performed at [facility] showed moderate severe left C3 3-4 foraminal stenosis.  Mild central stenosis at C4-5 with severe right foraminal stenosis.  Mild C5-6, C6-7 central stenosis.  Moderate to severe left C6-7 foraminal stenosis.  No abnormal cord changes.    PMFS History: Patient Active Problem List   Diagnosis Date Noted  . Primary osteoarthritis, right shoulder 04/27/2018  . Spondylosis without myelopathy or radiculopathy, cervical region 03/29/2018  . S/P lumbar microdiscectomy 03/29/2018  . Impingement syndrome of left shoulder 03/29/2018   Past Medical History:  Diagnosis Date  . Anxiety   . Depression   . Thyroid condition     No family history on file.  Past Surgical History:  Procedure Laterality Date  . BACK SURGERY  2017   lumbar  . BRAIN TUMOR EXCISION     Social History   Occupational History  . Not on file  Tobacco Use  . Smoking status: Never Smoker  . Smokeless tobacco: Never Used  Substance and Sexual Activity  . Alcohol use: Yes    Comment: wine with dinner  . Drug use: Never  . Sexual activity: Not on file

## 2018-08-12 ENCOUNTER — Encounter (INDEPENDENT_AMBULATORY_CARE_PROVIDER_SITE_OTHER): Payer: Self-pay | Admitting: Orthopaedic Surgery

## 2018-08-15 ENCOUNTER — Telehealth (INDEPENDENT_AMBULATORY_CARE_PROVIDER_SITE_OTHER): Payer: Self-pay | Admitting: Radiology

## 2018-08-15 NOTE — Telephone Encounter (Signed)
Patient faxed in a paper containing questions regarding the neck surgery she will be having. 1.  What kind of pain medications will I be given? 2.  What restrictions after surgery? 3.  How long before I can drive? 4.  What kind of assistance will I need during the recovery period?

## 2018-08-17 NOTE — Telephone Encounter (Signed)
I left voicemail for patient requesting return call to discuss surgery and answer questions that she has.

## 2018-08-18 NOTE — Telephone Encounter (Signed)
I left voicemail #2 for patient to return my call.

## 2018-08-18 NOTE — Telephone Encounter (Signed)
I called and spoke with patient. I advised she would be in a collar x 6 weeks and unable to drive. I also explained that in a normal situation, she may go home with short course of Oxycodone for post op pain. Patient is concerned as she took hydrocodone for her back and had to go to the ED for bowel impaction.  I did advise she would want to take stool softeners as pain medication does have that effect on people.  She asked if I could pass the message along to Dr. Ophelia Charter just to let him know that she is concerned about that happening again.    Questions answered.

## 2018-08-26 DIAGNOSIS — Z1211 Encounter for screening for malignant neoplasm of colon: Secondary | ICD-10-CM | POA: Diagnosis not present

## 2018-08-26 DIAGNOSIS — Z7189 Other specified counseling: Secondary | ICD-10-CM | POA: Diagnosis not present

## 2018-08-26 DIAGNOSIS — Z299 Encounter for prophylactic measures, unspecified: Secondary | ICD-10-CM | POA: Diagnosis not present

## 2018-08-26 DIAGNOSIS — E039 Hypothyroidism, unspecified: Secondary | ICD-10-CM | POA: Diagnosis not present

## 2018-08-26 DIAGNOSIS — Z1339 Encounter for screening examination for other mental health and behavioral disorders: Secondary | ICD-10-CM | POA: Diagnosis not present

## 2018-08-26 DIAGNOSIS — Z79899 Other long term (current) drug therapy: Secondary | ICD-10-CM | POA: Diagnosis not present

## 2018-08-26 DIAGNOSIS — Z1331 Encounter for screening for depression: Secondary | ICD-10-CM | POA: Diagnosis not present

## 2018-08-26 DIAGNOSIS — Z0000 Encounter for general adult medical examination without abnormal findings: Secondary | ICD-10-CM | POA: Diagnosis not present

## 2018-08-26 DIAGNOSIS — Z6831 Body mass index (BMI) 31.0-31.9, adult: Secondary | ICD-10-CM | POA: Diagnosis not present

## 2018-08-26 DIAGNOSIS — E2839 Other primary ovarian failure: Secondary | ICD-10-CM | POA: Diagnosis not present

## 2018-09-07 ENCOUNTER — Telehealth (INDEPENDENT_AMBULATORY_CARE_PROVIDER_SITE_OTHER): Payer: Self-pay

## 2018-09-07 NOTE — Telephone Encounter (Signed)
Patient would like a call back to schedule surgery for her neck.  Cb# is [phone].  Please advise.  Thank you.

## 2018-09-13 NOTE — Pre-Procedure Instructions (Signed)
Cienna Dumais  09/13/2018      [facility] [phone] - [place], [place] - [address] AT [facility] [address] [place] [address] Phone: [phone] Fax: [phone]    Your procedure is scheduled on September 16, 2018.  Report to [facility] Admitting at 8:20 AM.  Call this number if you have problems the morning of surgery:  [phone]   Remember:  Do not eat or drink after midnight.    Take these medicines the morning of surgery with A SIP OF WATER  Flonase nasal spray-if needed Gabapentin (neurontin) Levothyroxine (synthroid)  7 days prior to surgery STOP taking any Aspirin (unless otherwise instructed by your surgeon), Aleve, Naproxen, Ibuprofen, Motrin, Advil, Goody's, BC's, all herbal medications, fish oil, and all vitamins   Do not wear jewelry, make-up or nail polish.  Do not wear lotions, powders, or perfumes, or deodorant.  Do not shave 48 hours prior to surgery.    Do not bring valuables to the hospital.  [facility] is not responsible for any belongings or valuables.  Contacts, dentures or bridgework may not be worn into surgery.  Leave your suitcase in the car.  After surgery it may be brought to your room.  For patients admitted to the hospital, discharge time will be determined by your treatment team.  Patients discharged the day of surgery will not be allowed to drive home.    [facility]- Preparing For Surgery  Before surgery, you can play an important role. Because skin is not sterile, your skin needs to be as free of germs as possible. You can reduce the number of germs on your skin by washing with CHG (chlorahexidine gluconate) Soap before surgery.  CHG is an antiseptic cleaner which kills germs and bonds with the skin to continue killing germs even after washing.    Oral Hygiene is also important to reduce your risk of infection.  Remember - BRUSH YOUR TEETH THE MORNING OF SURGERY WITH  YOUR REGULAR TOOTHPASTE  Please do not use if you have an allergy to CHG or antibacterial soaps. If your skin becomes reddened/irritated stop using the CHG.  Do not shave (including legs and underarms) for at least 48 hours prior to first CHG shower. It is OK to shave your face.  Please follow these instructions carefully.   1. Shower the NIGHT BEFORE SURGERY and the MORNING OF SURGERY with CHG.   2. If you chose to wash your hair, wash your hair first as usual with your normal shampoo.  3. After you shampoo, rinse your hair and body thoroughly to remove the shampoo.  4. Use CHG as you would any other liquid soap. You can apply CHG directly to the skin and wash gently with a scrungie or a clean washcloth.   5. Apply the CHG Soap to your body ONLY FROM THE NECK DOWN.  Do not use on open wounds or open sores. Avoid contact with your eyes, ears, mouth and genitals (private parts). Wash Face and genitals (private parts)  with your normal soap.  6. Wash thoroughly, paying special attention to the area where your surgery will be performed.  7. Thoroughly rinse your body with warm water from the neck down.  8. DO NOT shower/wash with your normal soap after using and rinsing off the CHG Soap.  9. Pat yourself dry with a CLEAN TOWEL.  10. Wear CLEAN PAJAMAS to bed  the night before surgery, wear comfortable clothes the morning of surgery  11. Place CLEAN SHEETS on your bed the night of your first shower and DO NOT SLEEP WITH PETS.  Day of Surgery:  Do not apply any deodorants/lotions.  Please wear clean clothes to the hospital/surgery center.   Remember to brush your teeth WITH YOUR REGULAR TOOTHPASTE.   Please read over the following fact sheets that you were given. Pain Booklet, Coughing and Deep Breathing and Surgical Site Infection Prevention

## 2018-09-14 ENCOUNTER — Other Ambulatory Visit: Payer: Self-pay

## 2018-09-14 ENCOUNTER — Encounter (INDEPENDENT_AMBULATORY_CARE_PROVIDER_SITE_OTHER): Payer: Self-pay | Admitting: Surgery

## 2018-09-14 ENCOUNTER — Telehealth (INDEPENDENT_AMBULATORY_CARE_PROVIDER_SITE_OTHER): Payer: Self-pay | Admitting: Orthopedic Surgery

## 2018-09-14 ENCOUNTER — Encounter (HOSPITAL_COMMUNITY): Payer: Self-pay

## 2018-09-14 ENCOUNTER — Encounter (HOSPITAL_COMMUNITY)
Admission: RE | Admit: 2018-09-14 | Discharge: 2018-09-14 | Disposition: A | Discharge status: Home / Self Care | Payer: Medicare Other | Source: Ambulatory Visit | Attending: Orthopaedic Surgery | Admitting: Orthopaedic Surgery

## 2018-09-14 ENCOUNTER — Encounter (HOSPITAL_COMMUNITY)
Admission: RE | Admit: 2018-09-14 | Discharge: 2018-09-14 | Disposition: A | Discharge status: Home / Self Care | Payer: Medicare Other | Source: Ambulatory Visit | Attending: Surgery | Admitting: Surgery

## 2018-09-14 ENCOUNTER — Ambulatory Visit (INDEPENDENT_AMBULATORY_CARE_PROVIDER_SITE_OTHER): Payer: Medicare Other | Admitting: Surgery

## 2018-09-14 VITALS — Ht 63.0 in | Wt 170.0 lb

## 2018-09-14 DIAGNOSIS — F329 Major depressive disorder, single episode, unspecified: Secondary | ICD-10-CM | POA: Diagnosis not present

## 2018-09-14 DIAGNOSIS — Z79899 Other long term (current) drug therapy: Secondary | ICD-10-CM | POA: Diagnosis not present

## 2018-09-14 DIAGNOSIS — M4722 Other spondylosis with radiculopathy, cervical region: Secondary | ICD-10-CM | POA: Diagnosis not present

## 2018-09-14 DIAGNOSIS — Z01818 Encounter for other preprocedural examination: Secondary | ICD-10-CM | POA: Insufficient documentation

## 2018-09-14 DIAGNOSIS — M47812 Spondylosis without myelopathy or radiculopathy, cervical region: Secondary | ICD-10-CM

## 2018-09-14 DIAGNOSIS — Z885 Allergy status to narcotic agent status: Secondary | ICD-10-CM | POA: Diagnosis not present

## 2018-09-14 DIAGNOSIS — Z882 Allergy status to sulfonamides status: Secondary | ICD-10-CM | POA: Diagnosis not present

## 2018-09-14 DIAGNOSIS — R0989 Other specified symptoms and signs involving the circulatory and respiratory systems: Secondary | ICD-10-CM | POA: Diagnosis not present

## 2018-09-14 DIAGNOSIS — M4802 Spinal stenosis, cervical region: Secondary | ICD-10-CM | POA: Diagnosis not present

## 2018-09-14 DIAGNOSIS — F419 Anxiety disorder, unspecified: Secondary | ICD-10-CM | POA: Diagnosis not present

## 2018-09-14 DIAGNOSIS — E039 Hypothyroidism, unspecified: Secondary | ICD-10-CM | POA: Diagnosis not present

## 2018-09-14 HISTORY — DX: Nausea with vomiting, unspecified: R11.2

## 2018-09-14 HISTORY — DX: Other specified postprocedural states: Z98.890

## 2018-09-14 HISTORY — DX: Spinal stenosis, cervical region: M48.02

## 2018-09-14 HISTORY — DX: Unspecified osteoarthritis, unspecified site: M19.90

## 2018-09-14 HISTORY — DX: Other complications of anesthesia, initial encounter: T88.59XA

## 2018-09-14 HISTORY — DX: Adverse effect of unspecified anesthetic, initial encounter: T41.45XA

## 2018-09-14 HISTORY — DX: Other cervical disc displacement, unspecified cervical region: M50.20

## 2018-09-14 LAB — URINALYSIS, ROUTINE W REFLEX MICROSCOPIC
BILIRUBIN URINE: NEGATIVE
Glucose, UA: NEGATIVE mg/dL
HGB URINE DIPSTICK: NEGATIVE
Ketones, ur: NEGATIVE mg/dL
Nitrite: NEGATIVE
PROTEIN: NEGATIVE mg/dL
Specific Gravity, Urine: 1.015 (ref 1.005–1.030)
pH: 6.5 (ref 5.0–8.0)

## 2018-09-14 LAB — URINALYSIS, MICROSCOPIC (REFLEX): RBC / HPF: NONE SEEN RBC/hpf (ref 0–5)

## 2018-09-14 LAB — COMPREHENSIVE METABOLIC PANEL
ALBUMIN: 3.9 g/dL (ref 3.5–5.0)
ALT: 27 U/L (ref 0–44)
AST: 24 U/L (ref 15–41)
Alkaline Phosphatase: 72 U/L (ref 38–126)
Anion gap: 4 — ABNORMAL LOW (ref 5–15)
BUN: 14 mg/dL (ref 8–23)
CO2: 29 mmol/L (ref 22–32)
CREATININE: 1.03 mg/dL — AB (ref 0.44–1.00)
Calcium: 9.4 mg/dL (ref 8.9–10.3)
Chloride: 105 mmol/L (ref 98–111)
GFR calc non Af Amer: 56 mL/min — ABNORMAL LOW (ref >60)
Glucose, Bld: 93 mg/dL (ref 70–99)
Potassium: 4.5 mmol/L (ref 3.5–5.1)
SODIUM: 138 mmol/L (ref 135–145)
TOTAL PROTEIN: 7.2 g/dL (ref 6.5–8.1)
Total Bilirubin: 0.6 mg/dL (ref 0.3–1.2)

## 2018-09-14 LAB — SURGICAL PCR SCREEN
MRSA, PCR: NEGATIVE
STAPHYLOCOCCUS AUREUS: POSITIVE — AB

## 2018-09-14 LAB — CBC
HCT: 40.2 % (ref 36.0–46.0)
HEMOGLOBIN: 12.2 g/dL (ref 12.0–15.0)
MCH: 28.8 pg (ref 26.0–34.0)
MCHC: 30.3 g/dL (ref 30.0–36.0)
MCV: 94.8 fL (ref 80.0–100.0)
Platelets: 363 10*3/uL (ref 150–400)
RBC: 4.24 MIL/uL (ref 3.87–5.11)
RDW: 14 % (ref 11.5–15.5)
WBC: 7 10*3/uL (ref 4.0–10.5)
nRBC: 0 % (ref 0.0–0.2)

## 2018-09-14 NOTE — Telephone Encounter (Signed)
[name], RN with Short Stay called to advise that Ms. Elizabeth Hull's  pre-op UA was abnormal. She is scheduled for upcoming c-spine surgery. Please review and advise. Thank you

## 2018-09-14 NOTE — Progress Notes (Signed)
67 year old white female history of C4-5 HNP/stenosis for preop evaluation.  Symptoms unchanged.  Today full history and physical performed.  Wants to proceed with C4-5 fusion as scheduled.  All questions answered

## 2018-09-14 NOTE — Progress Notes (Signed)
PCP - Dr. Salley SlaughterAngela Boone  Cardiologist - Denies  Chest x-ray - 09/14/18  EKG - 09/14/18  Stress Test - Denies  ECHO - Denies  Cardiac Cath - Denies  AICD- na PM- na LOOP- na  Sleep Study - Denies CPAP - Denies  LABS- 09/14/18: CBC, CMP, PCR, UA  ASA- Denies   Anesthesia- No  Pt denies having chest pain, sob, or fever at this time. All instructions explained to the pt, with a verbal understanding of the material. Pt agrees to go over the instructions while at home for a better understanding. The opportunity to ask questions was provided.

## 2018-09-14 NOTE — Telephone Encounter (Signed)
Less thank 10 WBC per high powered field and nitrite neg.  Do not need to tx.   She will get ancef pre-op as planned. thanks

## 2018-09-14 NOTE — Progress Notes (Signed)
Delma Postheryl B., from Dr., Ophelia CharterYates' office made aware of the abnormal UA results. Will give to anesthesia for review.

## 2018-09-15 NOTE — Telephone Encounter (Signed)
Please let me know if you contacted short stay or would like me to. Thanks.

## 2018-09-15 NOTE — H&P (Signed)
Elizabeth Hull is an 67 y.o. female.   Chief Complaint: Neck pain and upper extremity radiculopathy HPI: Patient with history of C4-5 HNP/stenosis and the above complaint presents for surgical intervention.  Progressively worsening symptoms.  Failed conservative treatment.  Past Medical History:  Diagnosis Date  . Anxiety   . Arthritis   . Cervical spinal stenosis   . Complication of anesthesia   . Depression   . HNP (herniated nucleus pulposus), cervical   . PONV (postoperative nausea and vomiting)   . Thyroid condition     Past Surgical History:  Procedure Laterality Date  . BACK SURGERY  2017   lumbar  . BRAIN TUMOR EXCISION    . TUBAL LIGATION      No family history on file. Social History:  reports that she has never smoked. She has never used smokeless tobacco. She reports that she drinks alcohol. She reports that she does not use drugs.  Allergies:  Allergies  Allergen Reactions  . Hydrocodone Other (See Comments)    Unknown  . Meperidine Hcl Other (See Comments)    Unknown  . Nucynta [Tapentadol Hcl] Other (See Comments)    Unknown  . Sulfa Antibiotics Nausea Only  . Codeine Nausea Only and Nausea And Vomiting    No medications prior to admission.    Results for orders placed or performed during the hospital encounter of 09/14/18 (from the past 48 hour(s))  Surgical pcr screen     Status: Abnormal   Collection Time: 09/14/18  8:22 AM  Result Value Ref Range   MRSA, PCR NEGATIVE NEGATIVE   Staphylococcus aureus POSITIVE (A) NEGATIVE    Comment: (NOTE) The Xpert SA Assay (FDA approved for NASAL specimens in patients 20 years of age and older), is one component of a comprehensive surveillance program. It is not intended to diagnose infection nor to guide or monitor treatment.   CBC     Status: None   Collection Time: 09/14/18  8:23 AM  Result Value Ref Range   WBC 7.0 4.0 - 10.5 K/uL   RBC 4.24 3.87 - 5.11 MIL/uL   Hemoglobin 12.2 12.0 - 15.0 g/dL   HCT  40.2 36.0 - 46.0 %   MCV 94.8 80.0 - 100.0 fL   MCH 28.8 26.0 - 34.0 pg   MCHC 30.3 30.0 - 36.0 g/dL   RDW 14.0 11.5 - 15.5 %   Platelets 363 150 - 400 K/uL   nRBC 0.0 0.0 - 0.2 %    Comment: Performed at [facility], [place] [address], [place], [place] [address]  Comprehensive metabolic panel     Status: Abnormal   Collection Time: 09/14/18  8:23 AM  Result Value Ref Range   Sodium 138 135 - 145 mmol/L   Potassium 4.5 3.5 - 5.1 mmol/L   Chloride 105 98 - 111 mmol/L   CO2 29 22 - 32 mmol/L   Glucose, Bld 93 70 - 99 mg/dL   BUN 14 8 - 23 mg/dL   Creatinine, Ser 1.03 (H) 0.44 - 1.00 mg/dL   Calcium 9.4 8.9 - 10.3 mg/dL   Total Protein 7.2 6.5 - 8.1 g/dL   Albumin 3.9 3.5 - 5.0 g/dL   AST 24 15 - 41 U/L   ALT 27 0 - 44 U/L   Alkaline Phosphatase 72 38 - 126 U/L   Total Bilirubin 0.6 0.3 - 1.2 mg/dL   GFR calc non Af Amer 56 (L) >60 mL/min   GFR calc Af  Amer >60 >60 mL/min    Comment: (NOTE) The eGFR has been calculated using the CKD EPI equation. This calculation has not been validated in all clinical situations. eGFR's persistently <60 mL/min signify possible Chronic Kidney Disease.    Anion gap 4 (L) 5 - 15    Comment: Performed at [facility] [address] [place], [place] [address]  Urinalysis, Routine w reflex microscopic     Status: Abnormal   Collection Time: 09/14/18  8:23 AM  Result Value Ref Range   Color, Urine YELLOW YELLOW   APPearance CLOUDY (A) CLEAR   Specific Gravity, Urine 1.015 1.005 - 1.030   pH 6.5 5.0 - 8.0   Glucose, UA NEGATIVE NEGATIVE mg/dL   Hgb urine dipstick NEGATIVE NEGATIVE   Bilirubin Urine NEGATIVE NEGATIVE   Ketones, ur NEGATIVE NEGATIVE mg/dL   Protein, ur NEGATIVE NEGATIVE mg/dL   Nitrite NEGATIVE NEGATIVE   Leukocytes, UA SMALL (A) NEGATIVE    Comment: Performed at [facility] [address], [place], [place] [address]  Urinalysis, Microscopic (reflex)     Status: Abnormal   Collection Time: 09/14/18  8:23 AM   Result Value Ref Range   RBC / HPF NONE SEEN 0 - 5 RBC/hpf   WBC, UA 6-10 0 - 5 WBC/hpf   Bacteria, UA MANY (A) NONE SEEN   Squamous Epithelial / LPF 11-20 0 - 5    Comment: LESS THAN 10 mL OF URINE SUBMITTED Performed at [facility], [place] [address], [place], [place] [address]    Dg Chest 2 View  Result Date: 09/14/2018 CLINICAL DATA:  68 year old female with a history of cervical fusion surgery EXAM: CHEST - 2 VIEW COMPARISON:  None. FINDINGS: The heart size and mediastinal contours are within normal limits. Both lungs are clear. The visualized skeletal structures are unremarkable. IMPRESSION: Negative for acute cardiopulmonary disease Electronically Signed   By: Corrie Mckusick D.O.   On: 09/14/2018 09:29    Review of Systems  Constitutional: Negative.   HENT: Negative.   Respiratory: Negative.   Cardiovascular: Negative.   Genitourinary: Negative.   Musculoskeletal: Positive for neck pain.  Skin: Negative.   Neurological: Positive for tingling.  Psychiatric/Behavioral: Negative.     There were no vitals taken for this visit. Physical Exam  Constitutional: She is oriented to person, place, and time. No distress.  HENT:  Head: Normocephalic.  Eyes: EOM are normal.  Cardiovascular: Normal heart sounds.  Respiratory: Effort normal and breath sounds normal. No respiratory distress.  GI: She exhibits no distension.  Musculoskeletal: She exhibits tenderness.  Neurological: She is alert and oriented to person, place, and time.  Psychiatric: She has a normal mood and affect.     Assessment/Plan C4-5 HNP/stenosis, neck pain and upper extremity radiculopathy  We will proceed with C4-5 ACDF as scheduled.  Surgery procedure along with possible rehab/recovery time discussed.  All questions answered and wishes to proceed.  Benjiman Core, PA-C 09/15/2018, 6:08 PM

## 2018-09-16 ENCOUNTER — Other Ambulatory Visit: Payer: Self-pay

## 2018-09-16 ENCOUNTER — Encounter (HOSPITAL_COMMUNITY)
Admission: RE | Disposition: A | Discharge status: Home / Self Care | Payer: Self-pay | Source: Ambulatory Visit | Attending: Orthopaedic Surgery

## 2018-09-16 ENCOUNTER — Encounter (HOSPITAL_COMMUNITY): Payer: Self-pay | Admitting: *Deleted

## 2018-09-16 ENCOUNTER — Ambulatory Visit (HOSPITAL_COMMUNITY): Payer: Medicare Other

## 2018-09-16 ENCOUNTER — Ambulatory Visit (HOSPITAL_COMMUNITY): Payer: Medicare Other | Admitting: Physician Assistant

## 2018-09-16 ENCOUNTER — Observation Stay (HOSPITAL_COMMUNITY)
Admission: RE | Admit: 2018-09-16 | Discharge: 2018-09-17 | Disposition: A | Discharge status: Home / Self Care | Payer: Medicare Other | Source: Ambulatory Visit | Attending: Orthopaedic Surgery | Admitting: Orthopaedic Surgery

## 2018-09-16 ENCOUNTER — Ambulatory Visit (HOSPITAL_COMMUNITY): Payer: Medicare Other | Admitting: Certified Registered Nurse Anesthetist

## 2018-09-16 DIAGNOSIS — Z79899 Other long term (current) drug therapy: Secondary | ICD-10-CM | POA: Diagnosis not present

## 2018-09-16 DIAGNOSIS — M4802 Spinal stenosis, cervical region: Secondary | ICD-10-CM | POA: Diagnosis not present

## 2018-09-16 DIAGNOSIS — Z882 Allergy status to sulfonamides status: Secondary | ICD-10-CM | POA: Insufficient documentation

## 2018-09-16 DIAGNOSIS — M47812 Spondylosis without myelopathy or radiculopathy, cervical region: Secondary | ICD-10-CM | POA: Diagnosis not present

## 2018-09-16 DIAGNOSIS — Z885 Allergy status to narcotic agent status: Secondary | ICD-10-CM | POA: Insufficient documentation

## 2018-09-16 DIAGNOSIS — F329 Major depressive disorder, single episode, unspecified: Secondary | ICD-10-CM | POA: Insufficient documentation

## 2018-09-16 DIAGNOSIS — M4722 Other spondylosis with radiculopathy, cervical region: Principal | ICD-10-CM | POA: Insufficient documentation

## 2018-09-16 DIAGNOSIS — E039 Hypothyroidism, unspecified: Secondary | ICD-10-CM | POA: Insufficient documentation

## 2018-09-16 DIAGNOSIS — Z981 Arthrodesis status: Secondary | ICD-10-CM | POA: Diagnosis not present

## 2018-09-16 DIAGNOSIS — F419 Anxiety disorder, unspecified: Secondary | ICD-10-CM | POA: Insufficient documentation

## 2018-09-16 DIAGNOSIS — Z419 Encounter for procedure for purposes other than remedying health state, unspecified: Secondary | ICD-10-CM

## 2018-09-16 DIAGNOSIS — M5030 Other cervical disc degeneration, unspecified cervical region: Secondary | ICD-10-CM | POA: Diagnosis present

## 2018-09-16 HISTORY — PX: ANTERIOR CERVICAL DECOMP/DISCECTOMY FUSION: SHX1161

## 2018-09-16 SURGERY — ANTERIOR CERVICAL DECOMPRESSION/DISCECTOMY FUSION 1 LEVEL
Anesthesia: General

## 2018-09-16 MED ORDER — OXYCODONE HCL 5 MG PO TABS: 5.0000 mg | ORAL_TABLET | Freq: Once | ORAL | Status: DC | PRN

## 2018-09-16 MED ORDER — 0.9 % SODIUM CHLORIDE (POUR BTL) OPTIME
TOPICAL | Status: DC | PRN
  Administered 2018-09-16 (×2): 1000 mL

## 2018-09-16 MED ORDER — GABAPENTIN 600 MG PO TABS
1200.0000 mg | ORAL_TABLET | Freq: Every day | ORAL | Status: DC
  Administered 2018-09-16: 1200 mg via ORAL
  Filled 2018-09-16: qty 2

## 2018-09-16 MED ORDER — TRAZODONE HCL 50 MG PO TABS
50.0000 mg | ORAL_TABLET | Freq: Every day | ORAL | Status: DC
  Administered 2018-09-16: 50 mg via ORAL
  Filled 2018-09-16: qty 1

## 2018-09-16 MED ORDER — ONDANSETRON HCL 4 MG/2ML IJ SOLN
INTRAMUSCULAR | Status: AC
  Filled 2018-09-16: qty 2

## 2018-09-16 MED ORDER — CHLORHEXIDINE GLUCONATE 4 % EX LIQD: 60.0000 mL | Freq: Once | CUTANEOUS | Status: DC

## 2018-09-16 MED ORDER — ONDANSETRON HCL 4 MG PO TABS: 4.0000 mg | ORAL_TABLET | Freq: Four times a day (QID) | ORAL | Status: DC | PRN

## 2018-09-16 MED ORDER — ONDANSETRON HCL 4 MG/2ML IJ SOLN
4.0000 mg | Freq: Once | INTRAMUSCULAR | Status: AC | PRN
  Administered 2018-09-16: 4 mg via INTRAVENOUS

## 2018-09-16 MED ORDER — METHOCARBAMOL 500 MG PO TABS
500.0000 mg | ORAL_TABLET | Freq: Four times a day (QID) | ORAL | Status: DC | PRN
  Administered 2018-09-16 – 2018-09-17 (×2): 500 mg via ORAL
  Filled 2018-09-16 (×3): qty 1

## 2018-09-16 MED ORDER — AMITRIPTYLINE HCL 100 MG PO TABS
100.0000 mg | ORAL_TABLET | Freq: Every day | ORAL | Status: DC
  Administered 2018-09-16: 100 mg via ORAL
  Filled 2018-09-16: qty 1

## 2018-09-16 MED ORDER — PHENOL 1.4 % MT LIQD: 1.0000 | OROMUCOSAL | Status: DC | PRN

## 2018-09-16 MED ORDER — FLUTICASONE PROPIONATE 50 MCG/ACT NA SUSP
2.0000 | Freq: Every day | NASAL | Status: DC | PRN
  Filled 2018-09-16: qty 16

## 2018-09-16 MED ORDER — PROPOFOL 10 MG/ML IV BOLUS
INTRAVENOUS | Status: AC
  Filled 2018-09-16: qty 20

## 2018-09-16 MED ORDER — FENTANYL CITRATE (PF) 250 MCG/5ML IJ SOLN
INTRAMUSCULAR | Status: AC
  Filled 2018-09-16: qty 5

## 2018-09-16 MED ORDER — CEFAZOLIN SODIUM-DEXTROSE 2-4 GM/100ML-% IV SOLN
2.0000 g | INTRAVENOUS | Status: AC
  Administered 2018-09-16: 2 g via INTRAVENOUS

## 2018-09-16 MED ORDER — OXYCODONE HCL 5 MG PO TABS
5.0000 mg | ORAL_TABLET | ORAL | Status: DC | PRN
  Administered 2018-09-16 – 2018-09-17 (×4): 5 mg via ORAL
  Filled 2018-09-16 (×5): qty 1

## 2018-09-16 MED ORDER — ACETAMINOPHEN 650 MG RE SUPP: 650.0000 mg | RECTAL | Status: DC | PRN

## 2018-09-16 MED ORDER — ROCURONIUM BROMIDE 10 MG/ML (PF) SYRINGE
PREFILLED_SYRINGE | INTRAVENOUS | Status: DC | PRN
  Administered 2018-09-16 (×2): 10 mg via INTRAVENOUS
  Administered 2018-09-16: 50 mg via INTRAVENOUS

## 2018-09-16 MED ORDER — LEVOTHYROXINE SODIUM 100 MCG PO TABS
100.0000 ug | ORAL_TABLET | Freq: Every day | ORAL | Status: DC
  Administered 2018-09-17: 100 ug via ORAL
  Filled 2018-09-16: qty 1

## 2018-09-16 MED ORDER — PROGESTERONE MICRONIZED 100 MG PO CAPS
100.0000 mg | ORAL_CAPSULE | Freq: Every day | ORAL | Status: DC
  Administered 2018-09-16: 100 mg via ORAL
  Filled 2018-09-16: qty 1

## 2018-09-16 MED ORDER — BUPIVACAINE-EPINEPHRINE 0.5% -1:200000 IJ SOLN
INTRAMUSCULAR | Status: DC | PRN
  Administered 2018-09-16: 6 mL

## 2018-09-16 MED ORDER — ACETAMINOPHEN 325 MG PO TABS: 650.0000 mg | ORAL_TABLET | ORAL | Status: DC | PRN

## 2018-09-16 MED ORDER — SODIUM CHLORIDE 0.9% FLUSH: 3.0000 mL | Freq: Two times a day (BID) | INTRAVENOUS | Status: DC

## 2018-09-16 MED ORDER — LACTATED RINGERS IV SOLN
INTRAVENOUS | Status: DC
  Administered 2018-09-16: 11:00:00 via INTRAVENOUS

## 2018-09-16 MED ORDER — BUPIVACAINE-EPINEPHRINE 0.25% -1:200000 IJ SOLN
INTRAMUSCULAR | Status: AC
  Filled 2018-09-16: qty 1

## 2018-09-16 MED ORDER — SODIUM CHLORIDE 0.9% FLUSH: 3.0000 mL | INTRAVENOUS | Status: DC | PRN

## 2018-09-16 MED ORDER — OXYCODONE HCL 5 MG/5ML PO SOLN: 5.0000 mg | Freq: Once | ORAL | Status: DC | PRN

## 2018-09-16 MED ORDER — PROPOFOL 500 MG/50ML IV EMUL
INTRAVENOUS | Status: DC | PRN
  Administered 2018-09-16: 25 ug/kg/min via INTRAVENOUS

## 2018-09-16 MED ORDER — LIDOCAINE 2% (20 MG/ML) 5 ML SYRINGE
INTRAMUSCULAR | Status: AC
  Filled 2018-09-16: qty 5

## 2018-09-16 MED ORDER — FENTANYL CITRATE (PF) 100 MCG/2ML IJ SOLN
INTRAMUSCULAR | Status: AC
  Filled 2018-09-16: qty 2

## 2018-09-16 MED ORDER — SUGAMMADEX SODIUM 200 MG/2ML IV SOLN
INTRAVENOUS | Status: DC | PRN
  Administered 2018-09-16: 200 mg via INTRAVENOUS

## 2018-09-16 MED ORDER — CEFAZOLIN SODIUM-DEXTROSE 1-4 GM/50ML-% IV SOLN
1.0000 g | Freq: Three times a day (TID) | INTRAVENOUS | Status: AC
  Administered 2018-09-16 – 2018-09-17 (×2): 1 g via INTRAVENOUS
  Filled 2018-09-16 (×2): qty 50

## 2018-09-16 MED ORDER — PROMETHAZINE HCL 25 MG/ML IJ SOLN
6.2500 mg | Freq: Once | INTRAMUSCULAR | Status: AC
  Administered 2018-09-16: 6.25 mg via INTRAVENOUS

## 2018-09-16 MED ORDER — DEXAMETHASONE SODIUM PHOSPHATE 10 MG/ML IJ SOLN
INTRAMUSCULAR | Status: DC | PRN
  Administered 2018-09-16: 10 mg via INTRAVENOUS

## 2018-09-16 MED ORDER — ONDANSETRON HCL 4 MG/2ML IJ SOLN
INTRAMUSCULAR | Status: DC | PRN
  Administered 2018-09-16: 4 mg via INTRAVENOUS

## 2018-09-16 MED ORDER — SODIUM CHLORIDE 0.9 % IV SOLN
INTRAVENOUS | Status: DC | PRN
  Administered 2018-09-16: 30 ug/min via INTRAVENOUS

## 2018-09-16 MED ORDER — FENTANYL CITRATE (PF) 100 MCG/2ML IJ SOLN
INTRAMUSCULAR | Status: DC | PRN
  Administered 2018-09-16: 100 ug via INTRAVENOUS
  Administered 2018-09-16: 50 ug via INTRAVENOUS
  Administered 2018-09-16 (×2): 25 ug via INTRAVENOUS

## 2018-09-16 MED ORDER — SODIUM CHLORIDE 0.9 % IV SOLN: INTRAVENOUS | Status: DC

## 2018-09-16 MED ORDER — OXYCODONE-ACETAMINOPHEN 5-325 MG PO TABS: 2.0000 | ORAL_TABLET | Freq: Four times a day (QID) | ORAL | 0 refills | Status: DC | PRN

## 2018-09-16 MED ORDER — METHOCARBAMOL 1000 MG/10ML IJ SOLN
500.0000 mg | Freq: Four times a day (QID) | INTRAVENOUS | Status: DC | PRN
  Filled 2018-09-16: qty 5

## 2018-09-16 MED ORDER — DEXAMETHASONE SODIUM PHOSPHATE 10 MG/ML IJ SOLN
INTRAMUSCULAR | Status: AC
  Filled 2018-09-16: qty 1

## 2018-09-16 MED ORDER — ROCURONIUM BROMIDE 50 MG/5ML IV SOSY
PREFILLED_SYRINGE | INTRAVENOUS | Status: AC
  Filled 2018-09-16: qty 5

## 2018-09-16 MED ORDER — HYDROMORPHONE HCL 1 MG/ML IJ SOLN: 0.5000 mg | INTRAMUSCULAR | Status: DC | PRN

## 2018-09-16 MED ORDER — PHENYLEPHRINE 40 MCG/ML (10ML) SYRINGE FOR IV PUSH (FOR BLOOD PRESSURE SUPPORT)
PREFILLED_SYRINGE | INTRAVENOUS | Status: DC | PRN
  Administered 2018-09-16 (×2): 80 ug via INTRAVENOUS

## 2018-09-16 MED ORDER — DOCUSATE SODIUM 100 MG PO CAPS
100.0000 mg | ORAL_CAPSULE | Freq: Two times a day (BID) | ORAL | Status: DC
  Administered 2018-09-16: 100 mg via ORAL
  Filled 2018-09-16: qty 1

## 2018-09-16 MED ORDER — SCOPOLAMINE 1 MG/3DAYS TD PT72
MEDICATED_PATCH | TRANSDERMAL | Status: DC | PRN
  Administered 2018-09-16: 1 via TRANSDERMAL

## 2018-09-16 MED ORDER — PROPOFOL 10 MG/ML IV BOLUS
INTRAVENOUS | Status: DC | PRN
  Administered 2018-09-16: 130 mg via INTRAVENOUS

## 2018-09-16 MED ORDER — FENTANYL CITRATE (PF) 100 MCG/2ML IJ SOLN
25.0000 ug | INTRAMUSCULAR | Status: DC | PRN
  Administered 2018-09-16 (×2): 50 ug via INTRAVENOUS

## 2018-09-16 MED ORDER — DEXMEDETOMIDINE HCL 200 MCG/2ML IV SOLN
INTRAVENOUS | Status: DC | PRN
  Administered 2018-09-16 (×2): 8 ug via INTRAVENOUS

## 2018-09-16 MED ORDER — POLYETHYLENE GLYCOL 3350 17 G PO PACK: 17.0000 g | PACK | Freq: Every day | ORAL | Status: DC

## 2018-09-16 MED ORDER — PROMETHAZINE HCL 25 MG/ML IJ SOLN
INTRAMUSCULAR | Status: AC
  Filled 2018-09-16: qty 1

## 2018-09-16 MED ORDER — ROSUVASTATIN CALCIUM 5 MG PO TABS
5.0000 mg | ORAL_TABLET | Freq: Every day | ORAL | Status: DC
  Administered 2018-09-16: 5 mg via ORAL
  Filled 2018-09-16: qty 1

## 2018-09-16 MED ORDER — SODIUM CHLORIDE 0.9 % IV SOLN: 250.0000 mL | INTRAVENOUS | Status: DC

## 2018-09-16 MED ORDER — CEFAZOLIN SODIUM-DEXTROSE 2-4 GM/100ML-% IV SOLN
INTRAVENOUS | Status: AC
  Filled 2018-09-16: qty 100

## 2018-09-16 MED ORDER — ONDANSETRON HCL 4 MG/2ML IJ SOLN: 4.0000 mg | Freq: Four times a day (QID) | INTRAMUSCULAR | Status: DC | PRN

## 2018-09-16 MED ORDER — LIDOCAINE 2% (20 MG/ML) 5 ML SYRINGE
INTRAMUSCULAR | Status: DC | PRN
  Administered 2018-09-16: 40 mg via INTRAVENOUS
  Administered 2018-09-16: 60 mg via INTRAVENOUS

## 2018-09-16 MED ORDER — MENTHOL 3 MG MT LOZG
1.0000 | LOZENGE | OROMUCOSAL | Status: DC | PRN
  Administered 2018-09-16: 3 mg via ORAL
  Filled 2018-09-16: qty 9

## 2018-09-16 SURGICAL SUPPLY — 55 items
BENZOIN TINCTURE PRP APPL 2/3 (GAUZE/BANDAGES/DRESSINGS) ×5 IMPLANT
BIT DRILL SKYLINE 12MM (BIT) IMPLANT
BLADE CLIPPER SURG (BLADE) IMPLANT
BONE CERV LORDOTIC 14.5X12X7 (Bone Implant) ×3 IMPLANT
BUR ROUND FLUTED 4 SOFT TCH (BURR) ×1 IMPLANT
BUR ROUND FLUTED 4MM SOFT TCH (BURR) ×1
CLOSURE WOUND 1/2 X4 (GAUZE/BANDAGES/DRESSINGS) ×1
COLLAR CERV LO CONTOUR FIRM DE (SOFTGOODS) ×3 IMPLANT
COVER MAYO STAND STRL (DRAPES) ×5 IMPLANT
COVER SURGICAL LIGHT HANDLE (MISCELLANEOUS) ×3 IMPLANT
COVER WAND RF STERILE (DRAPES) ×3 IMPLANT
CRADLE DONUT ADULT HEAD (MISCELLANEOUS) ×3 IMPLANT
DRAPE C-ARM 42X72 X-RAY (DRAPES) ×3 IMPLANT
DRAPE HALF SHEET 40X57 (DRAPES) ×3 IMPLANT
DRAPE MICROSCOPE LEICA (MISCELLANEOUS) ×3 IMPLANT
DRILL BIT SKYLINE 12MM (BIT) ×2
DURAPREP 6ML APPLICATOR 50/CS (WOUND CARE) ×3 IMPLANT
ELECT COATED BLADE 2.86 ST (ELECTRODE) ×3 IMPLANT
ELECT REM PT RETURN 9FT ADLT (ELECTROSURGICAL) ×3
ELECTRODE REM PT RTRN 9FT ADLT (ELECTROSURGICAL) ×1 IMPLANT
EVACUATOR 1/8 PVC DRAIN (DRAIN) ×3 IMPLANT
GAUZE SPONGE 4X4 12PLY STRL (GAUZE/BANDAGES/DRESSINGS) ×3 IMPLANT
GAUZE SPONGE 4X4 12PLY STRL LF (GAUZE/BANDAGES/DRESSINGS) ×2 IMPLANT
GLOVE BIOGEL PI IND STRL 8 (GLOVE) ×2 IMPLANT
GLOVE BIOGEL PI INDICATOR 8 (GLOVE) ×4
GLOVE ORTHO TXT STRL SZ7.5 (GLOVE) ×6 IMPLANT
GOWN STRL REUS W/ TWL LRG LVL3 (GOWN DISPOSABLE) ×1 IMPLANT
GOWN STRL REUS W/ TWL XL LVL3 (GOWN DISPOSABLE) ×1 IMPLANT
GOWN STRL REUS W/TWL 2XL LVL3 (GOWN DISPOSABLE) ×3 IMPLANT
GOWN STRL REUS W/TWL LRG LVL3 (GOWN DISPOSABLE) ×2
GOWN STRL REUS W/TWL XL LVL3 (GOWN DISPOSABLE) ×2
GRAFT BNE SPCR VG2 14.5X12X7 (Bone Implant) IMPLANT
HEAD HALTER (SOFTGOODS) ×3 IMPLANT
KIT BASIN OR (CUSTOM PROCEDURE TRAY) ×3 IMPLANT
KIT TURNOVER KIT B (KITS) ×3 IMPLANT
MANIFOLD NEPTUNE II (INSTRUMENTS) ×3 IMPLANT
NDL 25GX 5/8IN NON SAFETY (NEEDLE) ×1 IMPLANT
NEEDLE 25GX 5/8IN NON SAFETY (NEEDLE) ×3 IMPLANT
NS IRRIG 1000ML POUR BTL (IV SOLUTION) ×3 IMPLANT
PACK ORTHO CERVICAL (CUSTOM PROCEDURE TRAY) ×3 IMPLANT
PAD ARMBOARD 7.5X6 YLW CONV (MISCELLANEOUS) ×6 IMPLANT
PATTIES SURGICAL .5 X.5 (GAUZE/BANDAGES/DRESSINGS) IMPLANT
PLATE SKYLINE 12MM (Plate) ×2 IMPLANT
SCREW VARIABLE SELF TAP 12MM (Screw) ×8 IMPLANT
STRIP CLOSURE SKIN 1/2X4 (GAUZE/BANDAGES/DRESSINGS) ×2 IMPLANT
SURGIFLO W/THROMBIN 8M KIT (HEMOSTASIS) IMPLANT
SUT BONE WAX W31G (SUTURE) ×3 IMPLANT
SUT VIC AB 3-0 PS2 18 (SUTURE) ×2
SUT VIC AB 3-0 PS2 18XBRD (SUTURE) ×1 IMPLANT
SUT VIC AB 4-0 PS2 27 (SUTURE) ×3 IMPLANT
SYR BULB 3OZ (MISCELLANEOUS) ×3 IMPLANT
TAPE CLOTH SURG 4X10 WHT LF (GAUZE/BANDAGES/DRESSINGS) ×2 IMPLANT
TOWEL OR 17X24 6PK STRL BLUE (TOWEL DISPOSABLE) ×3 IMPLANT
TOWEL OR 17X26 10 PK STRL BLUE (TOWEL DISPOSABLE) ×3 IMPLANT
WATER STERILE IRR 1000ML POUR (IV SOLUTION) ×3 IMPLANT

## 2018-09-16 NOTE — Op Note (Signed)
Preop diagnosis: C4-5 spondylosis with right foraminal stenosis  Postoperative diagnosis: Same  Procedure: C4-5 anterior cervical discectomy and fusion, allograft and plate.  Surgeon: Rodell Perna, MD  Assistant: Benjiman Core, PA-C medically necessary and present for the entire procedure  Anesthesia: General anesthesia orotracheal intubation with 6 cc skin local Marcaine  Drains: One Hemovac neck  EBL: Minimal  Implants:Skyline Depuy Synthes 12 mm plate.  12 mm screws x4., VG-2 cortical cancellus allograft 7 mm lordotic.  Implants placed at C4-5 level.  Procedure: After induction general anesthesia wrist restraints arms tucked at the side with yellow pads patient been intubated head halter traction was applied without weight.  DuraPrep Ancef prophylaxis and timeout procedure completed.  Area squared with towel sterile skin marker used at the midline extending to the left over the C4-5 level based on palpable landmarks Betadine Steri-Drape sterile female standard the head thyroid sheets and drapes and half sheet of the top.  Incision was made starting at the midline extending the left platysma divided in line with skin incision.  Omohyoid was caudad and palpable spurs at C4-5 were noted as well as C5-6.  Longus Coley met at the midline and there is more prominent spurs both right and left off the facet that actually were more ventrally placed in the midline disc space.  Anterior spurs were removed with 4 mm bur with the operative microscope and draped and brought in.  Spurs had been removed on the right and left side in order to have enough room for the plate to sit down flat over the C4-5 space.  Disc space was narrowed and Cloward curettes were used as well as a 4 mm round fluted bur to bur the endplates.  Progressing back to the posterior aspect where there were more spondylitic bone overhanging spurs on the right than left side corresponding with the preoperative MRI.  Disc fragments were teased from  the right foramina uncovertebral joints were stripped meticulously with a clogged curette and posterior longitudinal ligament was taken down decompressing the dura.  Epidural space was dry.  Trial sizers first with a 6 and then 7 mm trial spacer was used to millimeters gave good fit with CRNA pulling in order to insert the trial.  Submillimeter graft was selected opened marked at the midline and then placed with CRNA pulling on traction in order to countersink at 1 mm.  Additional spur removal had to be performed on the right side in order for the plate to sit down flat initially plate was placed with inferior screws and then superior screws were placed in the anterior aspect of the plate at the X5-4 level slightly off the bone.  Plate was removed and some spurs removed over on the right side at C4 which then allowed the plate to sit down flush with the bone.  Redrilling the upper from C4 holes right and left lateral screws to sit completely and pull the plate down flush with the bone at C4-5 level.  Plate was at the midline on AP fluoroscopic view and on lateral all screws are in good position.  Irrigation followed by using the tiny screwdriver locked down the locking screws.  Hemovac placed within out technique on the left side.  Repeat irrigation of the operative field which was dry.  Quad sheath and contents were lateral.  Esophagus and trachea were midline.  Platysma reapproximated with 3-0 Vicryl 4-0 Vicryl septic or closure tincture benzoin Steri-Strips Marcaine infiltration 4 x 4's tape and soft cervical collar.  Patient tolerated procedure well transfer the care of room in stable condition.

## 2018-09-16 NOTE — Anesthesia Preprocedure Evaluation (Signed)
Anesthesia Evaluation  Patient identified by MRN, date of birth, ID band Patient awake    Reviewed: Allergy & Precautions, NPO status , Patient's Chart, lab work & pertinent test results  History of Anesthesia Complications (+) PONVNegative for: history of anesthetic complications  Airway Mallampati: III  TM Distance: >3 FB Neck ROM: Full    Dental no notable dental hx.    Pulmonary neg pulmonary ROS,    Pulmonary exam normal        Cardiovascular negative cardio ROS Normal cardiovascular exam     Neuro/Psych PSYCHIATRIC DISORDERS Anxiety Depression negative neurological ROS     GI/Hepatic negative GI ROS, Neg liver ROS,   Endo/Other  Hypothyroidism   Renal/GU negative Renal ROS  negative genitourinary   Musculoskeletal negative musculoskeletal ROS (+)   Abdominal   Peds  Hematology negative hematology ROS (+)   Anesthesia Other Findings   Reproductive/Obstetrics                            Anesthesia Physical Anesthesia Plan  ASA: II  Anesthesia Plan: General   Post-op Pain Management:    Induction: Intravenous  PONV Risk Score and Plan: 4 or greater and Ondansetron, Dexamethasone, Midazolam, Treatment may vary due to age or medical condition and Scopolamine patch - Pre-op  Airway Management Planned: Video Laryngoscope Planned and Oral ETT  Additional Equipment: None  Intra-op Plan:   Post-operative Plan: Extubation in OR  Informed Consent: I have reviewed the patients History and Physical, chart, labs and discussed the procedure including the risks, benefits and alternatives for the proposed anesthesia with the patient or authorized representative who has indicated his/her understanding and acceptance.     Plan Discussed with:   Anesthesia Plan Comments:        Anesthesia Quick Evaluation

## 2018-09-16 NOTE — Anesthesia Procedure Notes (Signed)
Procedure Name: Intubation Performed by: Ezekiel InaBotts, Mattheu Brodersen H, CRNA Pre-anesthesia Checklist: Patient identified, Emergency Drugs available, Suction available and Patient being monitored Patient Re-evaluated:Patient Re-evaluated prior to induction Oxygen Delivery Method: Circle System Utilized Preoxygenation: Pre-oxygenation with 100% oxygen Induction Type: IV induction Ventilation: Mask ventilation without difficulty Laryngoscope Size: Glidescope and 4 Grade View: Grade I Tube type: Oral Tube size: 7.0 mm Number of attempts: 1 Airway Equipment and Method: Stylet Placement Confirmation: ETT inserted through vocal cords under direct vision,  positive ETCO2 and breath sounds checked- equal and bilateral Secured at: 21 cm Tube secured with: Tape Dental Injury: Teeth and Oropharynx as per pre-operative assessment

## 2018-09-16 NOTE — Interval H&P Note (Signed)
History and Physical Interval Note:  09/16/2018 9:50 AM  Elizabeth Hull  has presented today for surgery, with the diagnosis of right C4-5 foraminal stenosis, central stenosis  The various methods of treatment have been discussed with the patient and family. After consideration of risks, benefits and other options for treatment, the patient has consented to  Procedure(s): C4-C5 ANTERIOR CERVICAL DECOMPRESSION/DISCECTOMY FUSION, ALLOGRAFT, PLATE (N/A) as a surgical intervention .  The patient's history has been reviewed, patient examined, no change in status, stable for surgery.  I have reviewed the patient's chart and labs.  Questions were answered to the patient's satisfaction.     Eldred MangesMark C Yates

## 2018-09-16 NOTE — Progress Notes (Signed)
Received from PACU, pt alert and oriented. Ant neck dressing CDI, hemovac drain, charge.

## 2018-09-16 NOTE — Transfer of Care (Signed)
Immediate Anesthesia Transfer of Care Note  Patient: Elizabeth Hull  Procedure(s) Performed: C4-C5 ANTERIOR CERVICAL DECOMPRESSION/DISCECTOMY FUSION, ALLOGRAFT, PLATE (N/A )  Patient Location: PACU  Anesthesia Type:General  Level of Consciousness: awake, alert  and oriented  Airway & Oxygen Therapy: Patient Spontanous Breathing and Patient connected to face mask oxygen  Post-op Assessment: Report given to RN and Post -op Vital signs reviewed and stable  Post vital signs: Reviewed and stable  Last Vitals:  Vitals Value Taken Time  BP 140/71 09/16/2018 12:49 PM  Temp 36.7 C 09/16/2018 12:49 PM  Pulse 106 09/16/2018 12:49 PM  Resp 5 09/16/2018 12:49 PM  SpO2 95 % 09/16/2018 12:49 PM  Vitals shown include unvalidated device data.  Last Pain:  Vitals:   09/16/18 0911  TempSrc:   PainSc: 4          Complications: No apparent anesthesia complications

## 2018-09-17 DIAGNOSIS — F419 Anxiety disorder, unspecified: Secondary | ICD-10-CM | POA: Diagnosis not present

## 2018-09-17 DIAGNOSIS — E039 Hypothyroidism, unspecified: Secondary | ICD-10-CM | POA: Diagnosis not present

## 2018-09-17 DIAGNOSIS — M4722 Other spondylosis with radiculopathy, cervical region: Secondary | ICD-10-CM | POA: Diagnosis not present

## 2018-09-17 DIAGNOSIS — M4802 Spinal stenosis, cervical region: Secondary | ICD-10-CM | POA: Diagnosis not present

## 2018-09-17 DIAGNOSIS — Z79899 Other long term (current) drug therapy: Secondary | ICD-10-CM | POA: Diagnosis not present

## 2018-09-17 DIAGNOSIS — F329 Major depressive disorder, single episode, unspecified: Secondary | ICD-10-CM | POA: Diagnosis not present

## 2018-09-17 NOTE — Progress Notes (Signed)
Pt discharged home via wheelchair at this time, accompanied by spouse.

## 2018-09-17 NOTE — Progress Notes (Signed)
Pt seen and visited by Dr. Ophelia CharterYates.  Pt was given discharge instructions and wound care management.  MD removed hemovac--- incision site to anterior neck CDI with steri-strips secured with honeycomb dressing.  Pt A/O x 4; pain well-controlled.

## 2018-09-17 NOTE — Progress Notes (Signed)
   Subjective: 1 Day Post-Op Procedure(s) (LRB): C4-C5 ANTERIOR CERVICAL DECOMPRESSION/DISCECTOMY FUSION, ALLOGRAFT, PLATE (N/A) Patient reports pain as mild.    Objective: Vital signs in last 24 hours: Temp:  [97.6 F (36.4 C)-98.5 F (36.9 C)] 98.2 F (36.8 C) (11/16 0723) Pulse Rate:  [87-112] 87 (11/16 0723) Resp:  [0-20] 18 (11/16 0723) BP: (118-156)/(62-82) 124/62 (11/16 0723) SpO2:  [93 %-99 %] 98 % (11/16 0723)  Intake/Output from previous day: 11/15 0701 - 11/16 0700 In: 2405 [P.O.:480; I.V.:1500; IV Piggyback:100] Out: 170 [Drains:20; Blood:150] Intake/Output this shift: No intake/output data recorded.  No results for input(s): HGB in the last 72 hours. No results for input(s): WBC, RBC, HCT, PLT in the last 72 hours. No results for input(s): NA, K, CL, CO2, BUN, CREATININE, GLUCOSE, CALCIUM in the last 72 hours. No results for input(s): LABPT, INR in the last 72 hours.  right arm and shoulder moving better without pain with shoulder extension.  Dg Cervical Spine 2-3 Views  Result Date: 09/16/2018 CLINICAL DATA:  Cervical 4-5 ACDF * EXAM: DG C-ARM 61-120 MIN; CERVICAL SPINE - 2-3 VIEW COMPARISON:  None. FINDINGS: Three fluoroscopic views of the cervical spine show anterior cervical fusion hardware at the C4-C5 level. Hardware appears appropriately positioned. Fluoroscopy was provided for 10 seconds. IMPRESSION: Intraoperative fluoroscopic views of the cervical spine showing fixation hardware at the C4-C5 level. No evidence of surgical complicating feature. Electronically Signed   By: Bary RichardStan  Maynard M.D.   On: 09/16/2018 13:09   Dg C-arm 1-60 Min  Result Date: 09/16/2018 CLINICAL DATA:  Cervical 4-5 ACDF * EXAM: DG C-ARM 61-120 MIN; CERVICAL SPINE - 2-3 VIEW COMPARISON:  None. FINDINGS: Three fluoroscopic views of the cervical spine show anterior cervical fusion hardware at the C4-C5 level. Hardware appears appropriately positioned. Fluoroscopy was provided for 10  seconds. IMPRESSION: Intraoperative fluoroscopic views of the cervical spine showing fixation hardware at the C4-C5 level. No evidence of surgical complicating feature. Electronically Signed   By: Bary RichardStan  Maynard M.D.   On: 09/16/2018 13:09    Assessment/Plan: 1 Day Post-Op Procedure(s) (LRB): C4-C5 ANTERIOR CERVICAL DECOMPRESSION/DISCECTOMY FUSION, ALLOGRAFT, PLATE (N/A) Plan: discharge home, office one week. Drain removed, dressing changed.   Elizabeth Hull 09/17/2018, 8:24 AM

## 2018-09-19 ENCOUNTER — Encounter (HOSPITAL_COMMUNITY): Payer: Self-pay | Admitting: Orthopaedic Surgery

## 2018-09-19 NOTE — Anesthesia Postprocedure Evaluation (Signed)
Anesthesia Post Note  Patient: Elizabeth Hull  Procedure(s) Performed: C4-C5 ANTERIOR CERVICAL DECOMPRESSION/DISCECTOMY FUSION, ALLOGRAFT, PLATE (N/A )     Patient location during evaluation: PACU Anesthesia Type: General Level of consciousness: awake and alert Pain management: pain level controlled Vital Signs Assessment: post-procedure vital signs reviewed and stable Respiratory status: spontaneous breathing, nonlabored ventilation and respiratory function stable Cardiovascular status: blood pressure returned to baseline and stable Postop Assessment: no apparent nausea or vomiting Anesthetic complications: no    Last Vitals:  Vitals:   09/17/18 0329 09/17/18 0723  BP: (!) 144/64 124/62  Pulse: 97 87  Resp: 18 18  Temp: 36.9 C 36.8 C  SpO2: 97% 98%    Last Pain:  Vitals:   09/17/18 0723  TempSrc: Oral  PainSc:    Pain Goal: Patients Stated Pain Goal: 3 (09/17/18 0720)               Lucretia Kernarolyn E Montel Vanderhoof

## 2018-09-21 ENCOUNTER — Ambulatory Visit (INDEPENDENT_AMBULATORY_CARE_PROVIDER_SITE_OTHER): Payer: Self-pay

## 2018-09-21 ENCOUNTER — Encounter (INDEPENDENT_AMBULATORY_CARE_PROVIDER_SITE_OTHER): Payer: Self-pay | Admitting: Surgery

## 2018-09-21 ENCOUNTER — Ambulatory Visit (INDEPENDENT_AMBULATORY_CARE_PROVIDER_SITE_OTHER): Payer: Medicare Other | Admitting: Surgery

## 2018-09-21 VITALS — BP 117/75 | HR 100 | Temp 97.6°F | Ht 63.0 in | Wt 170.0 lb

## 2018-09-21 DIAGNOSIS — M542 Cervicalgia: Secondary | ICD-10-CM

## 2018-09-21 DIAGNOSIS — M4802 Spinal stenosis, cervical region: Secondary | ICD-10-CM

## 2018-09-21 DIAGNOSIS — M9981 Other biomechanical lesions of cervical region: Secondary | ICD-10-CM

## 2018-09-21 NOTE — Progress Notes (Signed)
67 year old white female who is status post C4-5 ACDF preop right shoulder and upper arm greatly improved.  No complaints of dysphasia or dyspnea.    Exam Pleasant white female alert and oriented no acute distress.  Surgical incision healing well.  New Steri-Strips applied.  Neurovascular intact.   Plan Patient advised that she must continue wearing her cervical collar.  No driving.  Follow-up with Dr. Ophelia CharterYates in the in 5 weeks for recheck and repeat x-rays.  Return sooner if needed.

## 2018-09-22 ENCOUNTER — Inpatient Hospital Stay (INDEPENDENT_AMBULATORY_CARE_PROVIDER_SITE_OTHER): Payer: Medicare Other | Admitting: Orthopaedic Surgery

## 2018-09-22 NOTE — Discharge Summary (Signed)
Patient ID: Elizabeth Hull MRN: [id number] DOB/AGE: 12/07/1950 67 y.o.  Admit date: 09/16/2018 Discharge date: 09/22/2018  Admission Diagnoses:  Active Problems:   Spondylosis without myelopathy or radiculopathy, cervical region   Foraminal stenosis of cervical region   Discharge Diagnoses:  Active Problems:   Spondylosis without myelopathy or radiculopathy, cervical region   Foraminal stenosis of cervical region  status post Procedure(s): C4-C5 ANTERIOR CERVICAL DECOMPRESSION/DISCECTOMY FUSION, ALLOGRAFT, PLATE  Past Medical History:  Diagnosis Date  . Anxiety   . Arthritis   . Cervical spinal stenosis   . Complication of anesthesia   . Depression   . HNP (herniated nucleus pulposus), cervical   . PONV (postoperative nausea and vomiting)   . Thyroid condition     Surgeries: Procedure(s): C4-C5 ANTERIOR CERVICAL DECOMPRESSION/DISCECTOMY FUSION, ALLOGRAFT, PLATE on 40/98/119111/15/2019   Consultants:   Discharged Condition: Improved  Hospital Course: Elizabeth Maxinnna Baade is an 67 y.o. female who was admitted 09/16/2018 for operative treatment of cervical stenosis. Patient failed conservative treatments (please see the history and physical for the specifics) and had severe unremitting pain that affects sleep, daily activities and work/hobbies. After pre-op clearance, the patient was taken to the operating room on 09/16/2018 and underwent  Procedure(s): C4-C5 ANTERIOR CERVICAL DECOMPRESSION/DISCECTOMY FUSION, ALLOGRAFT, PLATE.    Patient was given perioperative antibiotics:  Anti-infectives (From admission, onward)   Start     Dose/Rate Route Frequency Ordered Stop   09/16/18 1800  ceFAZolin (ANCEF) IVPB 1 g/50 mL premix     1 g 100 mL/hr over 30 Minutes Intravenous Every 8 hours 09/16/18 1513 09/17/18 0349   09/16/18 0848  ceFAZolin (ANCEF) 2-4 GM/100ML-% IVPB    Note to Pharmacy:  Rogelia MireMichael, Cynthia   : cabinet override      09/16/18 0848 09/16/18 1058   09/16/18 0845  ceFAZolin (ANCEF)  IVPB 2g/100 mL premix     2 g 200 mL/hr over 30 Minutes Intravenous On call to O.R. 09/16/18 0840 09/16/18 1058       Patient was given sequential compression devices and early ambulation to prevent DVT.   Patient benefited maximally from hospital stay and there were no complications. At the time of discharge, the patient was urinating/moving their bowels without difficulty, tolerating a regular diet, pain is controlled with oral pain medications and they have been cleared by PT/OT.   Recent vital signs: No data found.   Recent laboratory studies: No results for input(s): WBC, HGB, HCT, PLT, NA, K, CL, CO2, BUN, CREATININE, GLUCOSE, INR, CALCIUM in the last 72 hours.  Invalid input(s): PT, 2   Discharge Medications:   Allergies as of 09/17/2018      Reactions   Hydrocodone Other (See Comments)   Unknown   Meperidine Hcl Other (See Comments)   Unknown   Nucynta [tapentadol Hcl] Other (See Comments)   Unknown   Sulfa Antibiotics Nausea Only   Codeine Nausea Only, Nausea And Vomiting      Medication List    TAKE these medications   amitriptyline 50 MG tablet Commonly known as:  ELAVIL Take 100 mg by mouth at bedtime.   estradiol 0.025 mg/24hr patch Commonly known as:  CLIMARA - Dosed in mg/24 hr Place 0.025 mg onto the skin every Thursday.   fluticasone 50 MCG/ACT nasal spray Commonly known as:  FLONASE Place 2 sprays into both nostrils daily as needed for allergies or rhinitis.   gabapentin 600 MG tablet Commonly known as:  NEURONTIN Take 1,200 mg by mouth at bedtime.  levothyroxine 100 MCG tablet Commonly known as:  SYNTHROID, LEVOTHROID Take 100 mcg by mouth daily before breakfast.   multivitamin tablet Take 1 tablet by mouth daily.   oxyCODONE-acetaminophen 5-325 MG tablet Commonly known as:  PERCOCET/ROXICET Take 2 tablets by mouth every 6 (six) hours as needed for severe pain.   progesterone 100 MG capsule Commonly known as:  PROMETRIUM Take 100 mg by  mouth at bedtime.   rosuvastatin 5 MG tablet Commonly known as:  CRESTOR Take 5 mg by mouth at bedtime.   traZODone 50 MG tablet Commonly known as:  DESYREL Take 50 mg by mouth at bedtime.       Diagnostic Studies: Dg Chest 2 View  Result Date: 09/14/2018 CLINICAL DATA:  67 year old female with a history of cervical fusion surgery EXAM: CHEST - 2 VIEW COMPARISON:  None. FINDINGS: The heart size and mediastinal contours are within normal limits. Both lungs are clear. The visualized skeletal structures are unremarkable. IMPRESSION: Negative for acute cardiopulmonary disease Electronically Signed   By: Gilmer Mor D.O.   On: 09/14/2018 09:29   Dg Cervical Spine 2-3 Views  Result Date: 09/16/2018 CLINICAL DATA:  Cervical 4-5 ACDF * EXAM: DG C-ARM 61-120 MIN; CERVICAL SPINE - 2-3 VIEW COMPARISON:  None. FINDINGS: Three fluoroscopic views of the cervical spine show anterior cervical fusion hardware at the C4-C5 level. Hardware appears appropriately positioned. Fluoroscopy was provided for 10 seconds. IMPRESSION: Intraoperative fluoroscopic views of the cervical spine showing fixation hardware at the C4-C5 level. No evidence of surgical complicating feature. Electronically Signed   By: Bary Richard M.D.   On: 09/16/2018 13:09   Dg C-arm 1-60 Min  Result Date: 09/16/2018 CLINICAL DATA:  Cervical 4-5 ACDF * EXAM: DG C-ARM 61-120 MIN; CERVICAL SPINE - 2-3 VIEW COMPARISON:  None. FINDINGS: Three fluoroscopic views of the cervical spine show anterior cervical fusion hardware at the C4-C5 level. Hardware appears appropriately positioned. Fluoroscopy was provided for 10 seconds. IMPRESSION: Intraoperative fluoroscopic views of the cervical spine showing fixation hardware at the C4-C5 level. No evidence of surgical complicating feature. Electronically Signed   By: Bary Richard M.D.   On: 09/16/2018 13:09      Follow-up Information    Schedule an appointment as soon as possible for a visit with  Eldred Manges, MD.   Specialty:  Orthopedic Surgery Why:  need return office visit one week postop.  please call to schedule appointment.  Contact information: [address] [place] [address] [phone]        Eldred Manges, MD Follow up in 2 week(s).   Specialty:  Orthopedic Surgery Contact information: [address] [place] [address] [phone]           Discharge Plan:  discharge to home  Disposition:     Signed: Zonia Kief  09/22/2018, 10:00 AM

## 2018-10-20 ENCOUNTER — Ambulatory Visit (INDEPENDENT_AMBULATORY_CARE_PROVIDER_SITE_OTHER): Payer: Medicare Other

## 2018-10-20 ENCOUNTER — Ambulatory Visit (INDEPENDENT_AMBULATORY_CARE_PROVIDER_SITE_OTHER): Payer: Medicare Other | Admitting: Orthopaedic Surgery

## 2018-10-20 ENCOUNTER — Encounter (INDEPENDENT_AMBULATORY_CARE_PROVIDER_SITE_OTHER): Payer: Self-pay | Admitting: Orthopaedic Surgery

## 2018-10-20 VITALS — BP 123/73 | HR 111 | Ht 63.0 in | Wt 170.0 lb

## 2018-10-20 DIAGNOSIS — M9981 Other biomechanical lesions of cervical region: Secondary | ICD-10-CM

## 2018-10-20 DIAGNOSIS — M4802 Spinal stenosis, cervical region: Secondary | ICD-10-CM

## 2018-10-20 DIAGNOSIS — Z981 Arthrodesis status: Secondary | ICD-10-CM

## 2018-10-20 NOTE — Progress Notes (Signed)
Office Visit Note   Patient: Elizabeth Hull           Date of Birth: [birth year]           MRN: [id number] Visit Date: 10/20/2018              Requested by: No referring provider defined for this encounter. PCP: Patient, No Pcp Per   Assessment & Plan: Visit Diagnoses:  1. Neural foraminal stenosis of cervical spine     Plan: X-rays show her fusion is solid.  She can return for her right shoulder primary osteoarthritis in a couple months if she so desires for further evaluation and treatment.  He is happy with the results of cervical fusion.  Follow-Up Instructions: No follow-ups on file.   Orders:  Orders Placed This Encounter  Procedures  . XR Cervical Spine 2 or 3 views   No orders of the defined types were placed in this encounter.     Procedures: No procedures performed   Clinical Data: No additional findings.   Subjective: Chief Complaint  Patient presents with  . Neck - Follow-up    09/16/18 C4-5 ACDF    HPI 6 weeks post C4-5 ACDF for severe foraminal stenosis.  She still has some occasional itching in the right arm due to her brachial radial neuritis that she treats with ice application.  She still has limitation of right shoulder internal rotation with hand just back to the posterior axillary line due to her shoulder osteoarthritis.  Review of Systems updated unchanged from preop visit   Objective: Vital Signs: BP 123/73   Pulse (!) 111   Ht 5\' 3"  (1.6 m)   Wt 170 lb (77.1 kg)   BMI 30.11 kg/m   Physical Exam Constitutional:      Appearance: She is well-developed.  HENT:     Head: Normocephalic.     Right Ear: External ear normal.     Left Ear: External ear normal.  Eyes:     Pupils: Pupils are equal, round, and reactive to light.  Neck:     Thyroid: No thyromegaly.     Trachea: No tracheal deviation.  Cardiovascular:     Rate and Rhythm: Normal rate.  Pulmonary:     Effort: Pulmonary effort is normal.  Abdominal:     Palpations:  Abdomen is soft.  Skin:    General: Skin is warm and dry.  Neurological:     Mental Status: She is alert and oriented to person, place, and time.  Psychiatric:        Behavior: Behavior normal.     Ortho Exam right shoulder limited internal rotation to hand the posterior axillary line.  All healed anterior cervical incision.  No pain with cervical rotation. Specialty Comments:  No specialty comments available.  Imaging: Xr Cervical Spine 2 Or 3 Views  Result Date: 10/20/2018 Lateral flexion-extension cervical spine: Spine x-rays obtained and reviewed.  This shows well incorporated fusion at C4-5 with interbody plate and screws.  Associated spurring noted at C5-6 C6-7 unchanged from previous images. Impression: Satisfactory C4-5 ACDF without motion on flexion-extension views.    PMFS History: Patient Active Problem List   Diagnosis Date Noted  . Foraminal stenosis of cervical region 09/16/2018  . Primary osteoarthritis, right shoulder 04/27/2018  . Spondylosis without myelopathy or radiculopathy, cervical region 03/29/2018  . S/P lumbar microdiscectomy 03/29/2018  . Impingement syndrome of left shoulder 03/29/2018   Past Medical History:  Diagnosis Date  . Anxiety   .  Arthritis   . Cervical spinal stenosis   . Complication of anesthesia   . Depression   . HNP (herniated nucleus pulposus), cervical   . PONV (postoperative nausea and vomiting)   . Thyroid condition     No family history on file.  Past Surgical History:  Procedure Laterality Date  . ANTERIOR CERVICAL DECOMP/DISCECTOMY FUSION N/A 09/16/2018   Procedure: C4-C5 ANTERIOR CERVICAL DECOMPRESSION/DISCECTOMY FUSION, ALLOGRAFT, PLATE;  Surgeon: Eldred MangesYates,  C, MD;  Location: [facility] OR;  Service: Orthopedics;  Laterality: N/A;  . BACK SURGERY  2017   lumbar  . BRAIN TUMOR EXCISION    . TUBAL LIGATION     Social History   Occupational History  . Not on file  Tobacco Use  . Smoking status: Never Smoker  .  Smokeless tobacco: Never Used  Substance and Sexual Activity  . Alcohol use: Yes    Comment: wine with dinner  . Drug use: Never  . Sexual activity: Not on file

## 2019-01-10 DIAGNOSIS — R05 Cough: Secondary | ICD-10-CM | POA: Diagnosis not present

## 2019-01-10 DIAGNOSIS — B9689 Other specified bacterial agents as the cause of diseases classified elsewhere: Secondary | ICD-10-CM | POA: Diagnosis not present

## 2019-01-10 DIAGNOSIS — Z6830 Body mass index (BMI) 30.0-30.9, adult: Secondary | ICD-10-CM | POA: Diagnosis not present

## 2019-01-10 DIAGNOSIS — J208 Acute bronchitis due to other specified organisms: Secondary | ICD-10-CM | POA: Diagnosis not present

## 2019-01-10 DIAGNOSIS — J019 Acute sinusitis, unspecified: Secondary | ICD-10-CM | POA: Diagnosis not present

## 2019-02-27 DIAGNOSIS — G4700 Insomnia, unspecified: Secondary | ICD-10-CM | POA: Diagnosis not present

## 2019-02-27 DIAGNOSIS — Z299 Encounter for prophylactic measures, unspecified: Secondary | ICD-10-CM | POA: Diagnosis not present

## 2019-02-27 DIAGNOSIS — Z789 Other specified health status: Secondary | ICD-10-CM | POA: Diagnosis not present

## 2019-02-27 DIAGNOSIS — E039 Hypothyroidism, unspecified: Secondary | ICD-10-CM | POA: Diagnosis not present

## 2019-02-27 DIAGNOSIS — E7800 Pure hypercholesterolemia, unspecified: Secondary | ICD-10-CM | POA: Diagnosis not present

## 2019-02-27 DIAGNOSIS — Z6831 Body mass index (BMI) 31.0-31.9, adult: Secondary | ICD-10-CM | POA: Diagnosis not present

## 2019-05-12 DIAGNOSIS — H25813 Combined forms of age-related cataract, bilateral: Secondary | ICD-10-CM | POA: Diagnosis not present

## 2019-05-12 DIAGNOSIS — H25041 Posterior subcapsular polar age-related cataract, right eye: Secondary | ICD-10-CM | POA: Diagnosis not present

## 2019-05-12 DIAGNOSIS — H25013 Cortical age-related cataract, bilateral: Secondary | ICD-10-CM | POA: Diagnosis not present

## 2019-05-12 DIAGNOSIS — H2513 Age-related nuclear cataract, bilateral: Secondary | ICD-10-CM | POA: Diagnosis not present

## 2019-05-12 DIAGNOSIS — H524 Presbyopia: Secondary | ICD-10-CM | POA: Diagnosis not present

## 2019-05-24 DIAGNOSIS — E039 Hypothyroidism, unspecified: Secondary | ICD-10-CM | POA: Diagnosis not present

## 2019-05-31 DIAGNOSIS — H25013 Cortical age-related cataract, bilateral: Secondary | ICD-10-CM | POA: Diagnosis not present

## 2019-05-31 DIAGNOSIS — H2513 Age-related nuclear cataract, bilateral: Secondary | ICD-10-CM | POA: Diagnosis not present

## 2019-05-31 DIAGNOSIS — H40013 Open angle with borderline findings, low risk, bilateral: Secondary | ICD-10-CM | POA: Diagnosis not present

## 2019-05-31 DIAGNOSIS — H43813 Vitreous degeneration, bilateral: Secondary | ICD-10-CM | POA: Diagnosis not present

## 2019-05-31 DIAGNOSIS — H2511 Age-related nuclear cataract, right eye: Secondary | ICD-10-CM | POA: Diagnosis not present

## 2019-06-05 DIAGNOSIS — E039 Hypothyroidism, unspecified: Secondary | ICD-10-CM | POA: Diagnosis not present

## 2019-06-05 DIAGNOSIS — Z713 Dietary counseling and surveillance: Secondary | ICD-10-CM | POA: Diagnosis not present

## 2019-06-05 DIAGNOSIS — Z6830 Body mass index (BMI) 30.0-30.9, adult: Secondary | ICD-10-CM | POA: Diagnosis not present

## 2019-06-05 DIAGNOSIS — Z299 Encounter for prophylactic measures, unspecified: Secondary | ICD-10-CM | POA: Diagnosis not present

## 2019-06-05 DIAGNOSIS — F419 Anxiety disorder, unspecified: Secondary | ICD-10-CM | POA: Diagnosis not present

## 2019-06-06 DIAGNOSIS — H2511 Age-related nuclear cataract, right eye: Secondary | ICD-10-CM | POA: Diagnosis not present

## 2019-06-06 DIAGNOSIS — H25011 Cortical age-related cataract, right eye: Secondary | ICD-10-CM | POA: Diagnosis not present

## 2019-07-03 DIAGNOSIS — H2512 Age-related nuclear cataract, left eye: Secondary | ICD-10-CM | POA: Diagnosis not present

## 2019-07-03 DIAGNOSIS — H25012 Cortical age-related cataract, left eye: Secondary | ICD-10-CM | POA: Diagnosis not present

## 2019-08-09 DIAGNOSIS — Z23 Encounter for immunization: Secondary | ICD-10-CM | POA: Diagnosis not present

## 2019-08-31 DIAGNOSIS — Z1331 Encounter for screening for depression: Secondary | ICD-10-CM | POA: Diagnosis not present

## 2019-08-31 DIAGNOSIS — F419 Anxiety disorder, unspecified: Secondary | ICD-10-CM | POA: Diagnosis not present

## 2019-08-31 DIAGNOSIS — Z1339 Encounter for screening examination for other mental health and behavioral disorders: Secondary | ICD-10-CM | POA: Diagnosis not present

## 2019-08-31 DIAGNOSIS — E7800 Pure hypercholesterolemia, unspecified: Secondary | ICD-10-CM | POA: Diagnosis not present

## 2019-08-31 DIAGNOSIS — Z6831 Body mass index (BMI) 31.0-31.9, adult: Secondary | ICD-10-CM | POA: Diagnosis not present

## 2019-08-31 DIAGNOSIS — E039 Hypothyroidism, unspecified: Secondary | ICD-10-CM | POA: Diagnosis not present

## 2019-08-31 DIAGNOSIS — Z0000 Encounter for general adult medical examination without abnormal findings: Secondary | ICD-10-CM | POA: Diagnosis not present

## 2019-08-31 DIAGNOSIS — Z299 Encounter for prophylactic measures, unspecified: Secondary | ICD-10-CM | POA: Diagnosis not present

## 2019-08-31 DIAGNOSIS — Z7189 Other specified counseling: Secondary | ICD-10-CM | POA: Diagnosis not present

## 2019-08-31 DIAGNOSIS — Z1211 Encounter for screening for malignant neoplasm of colon: Secondary | ICD-10-CM | POA: Diagnosis not present

## 2019-09-04 DIAGNOSIS — E7800 Pure hypercholesterolemia, unspecified: Secondary | ICD-10-CM | POA: Diagnosis not present

## 2019-09-04 DIAGNOSIS — E039 Hypothyroidism, unspecified: Secondary | ICD-10-CM | POA: Diagnosis not present

## 2019-09-04 DIAGNOSIS — F419 Anxiety disorder, unspecified: Secondary | ICD-10-CM | POA: Diagnosis not present

## 2019-09-04 DIAGNOSIS — Z79899 Other long term (current) drug therapy: Secondary | ICD-10-CM | POA: Diagnosis not present

## 2019-10-12 DIAGNOSIS — H26491 Other secondary cataract, right eye: Secondary | ICD-10-CM | POA: Diagnosis not present

## 2019-10-12 DIAGNOSIS — H40013 Open angle with borderline findings, low risk, bilateral: Secondary | ICD-10-CM | POA: Diagnosis not present

## 2019-10-12 DIAGNOSIS — H25012 Cortical age-related cataract, left eye: Secondary | ICD-10-CM | POA: Diagnosis not present

## 2019-10-12 DIAGNOSIS — H2512 Age-related nuclear cataract, left eye: Secondary | ICD-10-CM | POA: Diagnosis not present

## 2019-10-24 DIAGNOSIS — H25012 Cortical age-related cataract, left eye: Secondary | ICD-10-CM | POA: Diagnosis not present

## 2019-10-24 DIAGNOSIS — H2512 Age-related nuclear cataract, left eye: Secondary | ICD-10-CM | POA: Diagnosis not present

## 2019-11-13 DIAGNOSIS — Z1231 Encounter for screening mammogram for malignant neoplasm of breast: Secondary | ICD-10-CM | POA: Diagnosis not present

## 2019-11-29 DIAGNOSIS — N6002 Solitary cyst of left breast: Secondary | ICD-10-CM | POA: Diagnosis not present

## 2019-11-29 DIAGNOSIS — R922 Inconclusive mammogram: Secondary | ICD-10-CM | POA: Diagnosis not present

## 2019-11-29 DIAGNOSIS — N6321 Unspecified lump in the left breast, upper outer quadrant: Secondary | ICD-10-CM | POA: Diagnosis not present

## 2019-12-04 DIAGNOSIS — E2839 Other primary ovarian failure: Secondary | ICD-10-CM | POA: Diagnosis not present

## 2019-12-14 DIAGNOSIS — Z23 Encounter for immunization: Secondary | ICD-10-CM | POA: Diagnosis not present

## 2020-01-03 DIAGNOSIS — J302 Other seasonal allergic rhinitis: Secondary | ICD-10-CM | POA: Diagnosis not present

## 2020-01-03 DIAGNOSIS — E039 Hypothyroidism, unspecified: Secondary | ICD-10-CM | POA: Diagnosis not present

## 2020-01-03 DIAGNOSIS — Z789 Other specified health status: Secondary | ICD-10-CM | POA: Diagnosis not present

## 2020-01-03 DIAGNOSIS — Z299 Encounter for prophylactic measures, unspecified: Secondary | ICD-10-CM | POA: Diagnosis not present

## 2020-01-03 DIAGNOSIS — Z6831 Body mass index (BMI) 31.0-31.9, adult: Secondary | ICD-10-CM | POA: Diagnosis not present

## 2020-01-03 DIAGNOSIS — J069 Acute upper respiratory infection, unspecified: Secondary | ICD-10-CM | POA: Diagnosis not present

## 2020-01-03 DIAGNOSIS — E7800 Pure hypercholesterolemia, unspecified: Secondary | ICD-10-CM | POA: Diagnosis not present

## 2020-01-03 DIAGNOSIS — F419 Anxiety disorder, unspecified: Secondary | ICD-10-CM | POA: Diagnosis not present

## 2020-01-09 DIAGNOSIS — Z789 Other specified health status: Secondary | ICD-10-CM | POA: Diagnosis not present

## 2020-01-09 DIAGNOSIS — Z299 Encounter for prophylactic measures, unspecified: Secondary | ICD-10-CM | POA: Diagnosis not present

## 2020-01-09 DIAGNOSIS — J302 Other seasonal allergic rhinitis: Secondary | ICD-10-CM | POA: Diagnosis not present

## 2020-01-11 DIAGNOSIS — Z23 Encounter for immunization: Secondary | ICD-10-CM | POA: Diagnosis not present

## 2020-01-25 DIAGNOSIS — J301 Allergic rhinitis due to pollen: Secondary | ICD-10-CM | POA: Diagnosis not present

## 2020-02-28 DIAGNOSIS — E039 Hypothyroidism, unspecified: Secondary | ICD-10-CM | POA: Diagnosis not present

## 2020-04-15 DIAGNOSIS — M7742 Metatarsalgia, left foot: Secondary | ICD-10-CM | POA: Diagnosis not present

## 2020-04-15 DIAGNOSIS — M79672 Pain in left foot: Secondary | ICD-10-CM | POA: Diagnosis not present

## 2020-04-15 DIAGNOSIS — M79671 Pain in right foot: Secondary | ICD-10-CM | POA: Diagnosis not present

## 2020-04-15 DIAGNOSIS — M779 Enthesopathy, unspecified: Secondary | ICD-10-CM | POA: Diagnosis not present

## 2020-04-15 DIAGNOSIS — M7741 Metatarsalgia, right foot: Secondary | ICD-10-CM | POA: Diagnosis not present

## 2020-07-06 DIAGNOSIS — Z23 Encounter for immunization: Secondary | ICD-10-CM | POA: Diagnosis not present

## 2020-08-23 DIAGNOSIS — Z23 Encounter for immunization: Secondary | ICD-10-CM | POA: Diagnosis not present

## 2020-09-03 DIAGNOSIS — Z79899 Other long term (current) drug therapy: Secondary | ICD-10-CM | POA: Diagnosis not present

## 2020-09-03 DIAGNOSIS — Z1339 Encounter for screening examination for other mental health and behavioral disorders: Secondary | ICD-10-CM | POA: Diagnosis not present

## 2020-09-03 DIAGNOSIS — Z6830 Body mass index (BMI) 30.0-30.9, adult: Secondary | ICD-10-CM | POA: Diagnosis not present

## 2020-09-03 DIAGNOSIS — Z1331 Encounter for screening for depression: Secondary | ICD-10-CM | POA: Diagnosis not present

## 2020-09-03 DIAGNOSIS — Z299 Encounter for prophylactic measures, unspecified: Secondary | ICD-10-CM | POA: Diagnosis not present

## 2020-09-03 DIAGNOSIS — Z7189 Other specified counseling: Secondary | ICD-10-CM | POA: Diagnosis not present

## 2020-09-03 DIAGNOSIS — Z0000 Encounter for general adult medical examination without abnormal findings: Secondary | ICD-10-CM | POA: Diagnosis not present

## 2020-09-03 DIAGNOSIS — E039 Hypothyroidism, unspecified: Secondary | ICD-10-CM | POA: Diagnosis not present

## 2020-09-03 DIAGNOSIS — R5383 Other fatigue: Secondary | ICD-10-CM | POA: Diagnosis not present

## 2020-09-03 DIAGNOSIS — E559 Vitamin D deficiency, unspecified: Secondary | ICD-10-CM | POA: Diagnosis not present

## 2020-09-03 DIAGNOSIS — E7800 Pure hypercholesterolemia, unspecified: Secondary | ICD-10-CM | POA: Diagnosis not present

## 2020-12-04 DIAGNOSIS — G4700 Insomnia, unspecified: Secondary | ICD-10-CM | POA: Diagnosis not present

## 2020-12-04 DIAGNOSIS — Z299 Encounter for prophylactic measures, unspecified: Secondary | ICD-10-CM | POA: Diagnosis not present

## 2020-12-04 DIAGNOSIS — F419 Anxiety disorder, unspecified: Secondary | ICD-10-CM | POA: Diagnosis not present

## 2020-12-04 DIAGNOSIS — R0981 Nasal congestion: Secondary | ICD-10-CM | POA: Diagnosis not present

## 2020-12-04 DIAGNOSIS — E039 Hypothyroidism, unspecified: Secondary | ICD-10-CM | POA: Diagnosis not present

## 2020-12-09 DIAGNOSIS — H04123 Dry eye syndrome of bilateral lacrimal glands: Secondary | ICD-10-CM | POA: Diagnosis not present

## 2020-12-23 DIAGNOSIS — Z961 Presence of intraocular lens: Secondary | ICD-10-CM | POA: Diagnosis not present

## 2020-12-23 DIAGNOSIS — H43391 Other vitreous opacities, right eye: Secondary | ICD-10-CM | POA: Diagnosis not present

## 2020-12-23 DIAGNOSIS — H40013 Open angle with borderline findings, low risk, bilateral: Secondary | ICD-10-CM | POA: Diagnosis not present

## 2020-12-23 DIAGNOSIS — H43392 Other vitreous opacities, left eye: Secondary | ICD-10-CM | POA: Diagnosis not present

## 2020-12-31 DIAGNOSIS — M542 Cervicalgia: Secondary | ICD-10-CM | POA: Diagnosis not present

## 2020-12-31 DIAGNOSIS — Z299 Encounter for prophylactic measures, unspecified: Secondary | ICD-10-CM | POA: Diagnosis not present

## 2020-12-31 DIAGNOSIS — R55 Syncope and collapse: Secondary | ICD-10-CM | POA: Diagnosis not present

## 2020-12-31 DIAGNOSIS — E039 Hypothyroidism, unspecified: Secondary | ICD-10-CM | POA: Diagnosis not present

## 2020-12-31 DIAGNOSIS — R0602 Shortness of breath: Secondary | ICD-10-CM | POA: Diagnosis not present

## 2021-01-01 DIAGNOSIS — H43813 Vitreous degeneration, bilateral: Secondary | ICD-10-CM | POA: Diagnosis not present

## 2021-01-07 DIAGNOSIS — R42 Dizziness and giddiness: Secondary | ICD-10-CM | POA: Diagnosis not present

## 2021-01-07 DIAGNOSIS — R55 Syncope and collapse: Secondary | ICD-10-CM | POA: Diagnosis not present

## 2021-01-13 DIAGNOSIS — I6523 Occlusion and stenosis of bilateral carotid arteries: Secondary | ICD-10-CM | POA: Diagnosis not present

## 2021-01-13 DIAGNOSIS — R55 Syncope and collapse: Secondary | ICD-10-CM | POA: Diagnosis not present

## 2021-01-30 DIAGNOSIS — Z23 Encounter for immunization: Secondary | ICD-10-CM | POA: Diagnosis not present

## 2021-01-30 DIAGNOSIS — K582 Mixed irritable bowel syndrome: Secondary | ICD-10-CM | POA: Diagnosis not present

## 2021-01-30 DIAGNOSIS — Z299 Encounter for prophylactic measures, unspecified: Secondary | ICD-10-CM | POA: Diagnosis not present

## 2021-01-30 DIAGNOSIS — E039 Hypothyroidism, unspecified: Secondary | ICD-10-CM | POA: Diagnosis not present

## 2021-03-26 DIAGNOSIS — H532 Diplopia: Secondary | ICD-10-CM | POA: Diagnosis not present

## 2021-03-26 DIAGNOSIS — H43392 Other vitreous opacities, left eye: Secondary | ICD-10-CM | POA: Diagnosis not present

## 2021-03-26 DIAGNOSIS — H43393 Other vitreous opacities, bilateral: Secondary | ICD-10-CM | POA: Diagnosis not present

## 2021-03-26 DIAGNOSIS — H40013 Open angle with borderline findings, low risk, bilateral: Secondary | ICD-10-CM | POA: Diagnosis not present

## 2021-03-26 DIAGNOSIS — Z961 Presence of intraocular lens: Secondary | ICD-10-CM | POA: Diagnosis not present

## 2021-03-27 DIAGNOSIS — Z6824 Body mass index (BMI) 24.0-24.9, adult: Secondary | ICD-10-CM | POA: Diagnosis not present

## 2021-03-27 DIAGNOSIS — N952 Postmenopausal atrophic vaginitis: Secondary | ICD-10-CM | POA: Diagnosis not present

## 2021-03-27 DIAGNOSIS — L680 Hirsutism: Secondary | ICD-10-CM | POA: Diagnosis not present

## 2021-03-27 DIAGNOSIS — N941 Unspecified dyspareunia: Secondary | ICD-10-CM | POA: Diagnosis not present

## 2021-04-02 DIAGNOSIS — H43813 Vitreous degeneration, bilateral: Secondary | ICD-10-CM | POA: Diagnosis not present

## 2021-05-27 DIAGNOSIS — Z6830 Body mass index (BMI) 30.0-30.9, adult: Secondary | ICD-10-CM | POA: Diagnosis not present

## 2021-05-27 DIAGNOSIS — N952 Postmenopausal atrophic vaginitis: Secondary | ICD-10-CM | POA: Diagnosis not present

## 2021-07-08 DIAGNOSIS — H43393 Other vitreous opacities, bilateral: Secondary | ICD-10-CM | POA: Diagnosis not present

## 2021-07-11 DIAGNOSIS — Z23 Encounter for immunization: Secondary | ICD-10-CM | POA: Diagnosis not present

## 2021-07-30 DIAGNOSIS — F419 Anxiety disorder, unspecified: Secondary | ICD-10-CM | POA: Diagnosis not present

## 2021-07-30 DIAGNOSIS — Z6831 Body mass index (BMI) 31.0-31.9, adult: Secondary | ICD-10-CM | POA: Diagnosis not present

## 2021-07-30 DIAGNOSIS — Z299 Encounter for prophylactic measures, unspecified: Secondary | ICD-10-CM | POA: Diagnosis not present

## 2021-07-30 DIAGNOSIS — R0789 Other chest pain: Secondary | ICD-10-CM | POA: Diagnosis not present

## 2021-07-30 DIAGNOSIS — R0781 Pleurodynia: Secondary | ICD-10-CM | POA: Diagnosis not present

## 2021-07-30 DIAGNOSIS — Z789 Other specified health status: Secondary | ICD-10-CM | POA: Diagnosis not present

## 2021-07-30 DIAGNOSIS — M19011 Primary osteoarthritis, right shoulder: Secondary | ICD-10-CM | POA: Diagnosis not present

## 2021-07-30 DIAGNOSIS — E039 Hypothyroidism, unspecified: Secondary | ICD-10-CM | POA: Diagnosis not present

## 2021-08-25 DIAGNOSIS — H04123 Dry eye syndrome of bilateral lacrimal glands: Secondary | ICD-10-CM | POA: Diagnosis not present

## 2021-09-08 DIAGNOSIS — R5383 Other fatigue: Secondary | ICD-10-CM | POA: Diagnosis not present

## 2021-09-08 DIAGNOSIS — Z79899 Other long term (current) drug therapy: Secondary | ICD-10-CM | POA: Diagnosis not present

## 2021-09-08 DIAGNOSIS — E559 Vitamin D deficiency, unspecified: Secondary | ICD-10-CM | POA: Diagnosis not present

## 2021-09-08 DIAGNOSIS — Z0000 Encounter for general adult medical examination without abnormal findings: Secondary | ICD-10-CM | POA: Diagnosis not present

## 2021-09-08 DIAGNOSIS — Z6831 Body mass index (BMI) 31.0-31.9, adult: Secondary | ICD-10-CM | POA: Diagnosis not present

## 2021-09-08 DIAGNOSIS — Z299 Encounter for prophylactic measures, unspecified: Secondary | ICD-10-CM | POA: Diagnosis not present

## 2021-09-08 DIAGNOSIS — E039 Hypothyroidism, unspecified: Secondary | ICD-10-CM | POA: Diagnosis not present

## 2021-09-08 DIAGNOSIS — E7800 Pure hypercholesterolemia, unspecified: Secondary | ICD-10-CM | POA: Diagnosis not present

## 2021-09-08 DIAGNOSIS — Z1331 Encounter for screening for depression: Secondary | ICD-10-CM | POA: Diagnosis not present

## 2021-09-08 DIAGNOSIS — F419 Anxiety disorder, unspecified: Secondary | ICD-10-CM | POA: Diagnosis not present

## 2021-09-08 DIAGNOSIS — Z789 Other specified health status: Secondary | ICD-10-CM | POA: Diagnosis not present

## 2021-09-08 DIAGNOSIS — Z7189 Other specified counseling: Secondary | ICD-10-CM | POA: Diagnosis not present

## 2021-09-08 DIAGNOSIS — Z1339 Encounter for screening examination for other mental health and behavioral disorders: Secondary | ICD-10-CM | POA: Diagnosis not present

## 2021-09-09 ENCOUNTER — Other Ambulatory Visit: Payer: Self-pay | Admitting: Internal Medicine

## 2021-09-09 ENCOUNTER — Ambulatory Visit
Admission: RE | Admit: 2021-09-09 | Discharge: 2021-09-09 | Disposition: A | Discharge status: Home / Self Care | Payer: Medicare Other | Source: Ambulatory Visit | Attending: Internal Medicine | Admitting: Internal Medicine

## 2021-09-09 ENCOUNTER — Other Ambulatory Visit: Payer: Self-pay

## 2021-09-09 DIAGNOSIS — Z1231 Encounter for screening mammogram for malignant neoplasm of breast: Secondary | ICD-10-CM

## 2021-11-24 DIAGNOSIS — H43393 Other vitreous opacities, bilateral: Secondary | ICD-10-CM | POA: Diagnosis not present

## 2021-11-24 DIAGNOSIS — H524 Presbyopia: Secondary | ICD-10-CM | POA: Diagnosis not present

## 2021-12-01 DIAGNOSIS — J0140 Acute pansinusitis, unspecified: Secondary | ICD-10-CM | POA: Diagnosis not present

## 2021-12-16 DIAGNOSIS — Z79890 Hormone replacement therapy (postmenopausal): Secondary | ICD-10-CM | POA: Diagnosis not present

## 2022-01-27 DIAGNOSIS — Z299 Encounter for prophylactic measures, unspecified: Secondary | ICD-10-CM | POA: Diagnosis not present

## 2022-01-27 DIAGNOSIS — Z789 Other specified health status: Secondary | ICD-10-CM | POA: Diagnosis not present

## 2022-01-27 DIAGNOSIS — Z6831 Body mass index (BMI) 31.0-31.9, adult: Secondary | ICD-10-CM | POA: Diagnosis not present

## 2022-01-27 DIAGNOSIS — Z713 Dietary counseling and surveillance: Secondary | ICD-10-CM | POA: Diagnosis not present

## 2022-01-27 DIAGNOSIS — F419 Anxiety disorder, unspecified: Secondary | ICD-10-CM | POA: Diagnosis not present

## 2022-02-10 DIAGNOSIS — F419 Anxiety disorder, unspecified: Secondary | ICD-10-CM | POA: Diagnosis not present

## 2022-02-10 DIAGNOSIS — Z6831 Body mass index (BMI) 31.0-31.9, adult: Secondary | ICD-10-CM | POA: Diagnosis not present

## 2022-02-10 DIAGNOSIS — Z713 Dietary counseling and surveillance: Secondary | ICD-10-CM | POA: Diagnosis not present

## 2022-02-10 DIAGNOSIS — Z299 Encounter for prophylactic measures, unspecified: Secondary | ICD-10-CM | POA: Diagnosis not present

## 2022-02-24 DIAGNOSIS — F32A Depression, unspecified: Secondary | ICD-10-CM | POA: Diagnosis not present

## 2022-02-24 DIAGNOSIS — Z299 Encounter for prophylactic measures, unspecified: Secondary | ICD-10-CM | POA: Diagnosis not present

## 2022-02-24 DIAGNOSIS — E2839 Other primary ovarian failure: Secondary | ICD-10-CM | POA: Diagnosis not present

## 2022-02-24 DIAGNOSIS — Z713 Dietary counseling and surveillance: Secondary | ICD-10-CM | POA: Diagnosis not present

## 2022-02-24 DIAGNOSIS — Z6832 Body mass index (BMI) 32.0-32.9, adult: Secondary | ICD-10-CM | POA: Diagnosis not present

## 2022-03-10 DIAGNOSIS — Z6831 Body mass index (BMI) 31.0-31.9, adult: Secondary | ICD-10-CM | POA: Diagnosis not present

## 2022-03-10 DIAGNOSIS — Z299 Encounter for prophylactic measures, unspecified: Secondary | ICD-10-CM | POA: Diagnosis not present

## 2022-03-10 DIAGNOSIS — Z713 Dietary counseling and surveillance: Secondary | ICD-10-CM | POA: Diagnosis not present

## 2022-03-10 DIAGNOSIS — F419 Anxiety disorder, unspecified: Secondary | ICD-10-CM | POA: Diagnosis not present

## 2022-04-01 DIAGNOSIS — Z23 Encounter for immunization: Secondary | ICD-10-CM | POA: Diagnosis not present

## 2022-04-03 ENCOUNTER — Ambulatory Visit (INDEPENDENT_AMBULATORY_CARE_PROVIDER_SITE_OTHER): Payer: Medicare Other | Admitting: Psychiatry

## 2022-04-03 ENCOUNTER — Encounter: Payer: Self-pay | Admitting: Psychiatry

## 2022-04-03 VITALS — BP 147/75 | HR 86 | Temp 97.8°F | Ht 62.0 in | Wt 173.4 lb

## 2022-04-03 DIAGNOSIS — M25519 Pain in unspecified shoulder: Secondary | ICD-10-CM | POA: Insufficient documentation

## 2022-04-03 DIAGNOSIS — M545 Low back pain, unspecified: Secondary | ICD-10-CM | POA: Insufficient documentation

## 2022-04-03 DIAGNOSIS — M797 Fibromyalgia: Secondary | ICD-10-CM | POA: Insufficient documentation

## 2022-04-03 DIAGNOSIS — M25529 Pain in unspecified elbow: Secondary | ICD-10-CM | POA: Insufficient documentation

## 2022-04-03 DIAGNOSIS — F411 Generalized anxiety disorder: Secondary | ICD-10-CM | POA: Diagnosis not present

## 2022-04-03 DIAGNOSIS — F331 Major depressive disorder, recurrent, moderate: Secondary | ICD-10-CM | POA: Diagnosis not present

## 2022-04-03 DIAGNOSIS — M76899 Other specified enthesopathies of unspecified lower limb, excluding foot: Secondary | ICD-10-CM | POA: Insufficient documentation

## 2022-04-03 DIAGNOSIS — M5020 Other cervical disc displacement, unspecified cervical region: Secondary | ICD-10-CM | POA: Insufficient documentation

## 2022-04-03 DIAGNOSIS — M539 Dorsopathy, unspecified: Secondary | ICD-10-CM | POA: Insufficient documentation

## 2022-04-03 DIAGNOSIS — M542 Cervicalgia: Secondary | ICD-10-CM | POA: Insufficient documentation

## 2022-04-03 DIAGNOSIS — M75120 Complete rotator cuff tear or rupture of unspecified shoulder, not specified as traumatic: Secondary | ICD-10-CM | POA: Insufficient documentation

## 2022-04-03 DIAGNOSIS — M47817 Spondylosis without myelopathy or radiculopathy, lumbosacral region: Secondary | ICD-10-CM | POA: Insufficient documentation

## 2022-04-03 DIAGNOSIS — S46119A Strain of muscle, fascia and tendon of long head of biceps, unspecified arm, initial encounter: Secondary | ICD-10-CM | POA: Insufficient documentation

## 2022-04-03 MED ORDER — HYDROXYZINE HCL 25 MG PO TABS: 12.5000 mg | ORAL_TABLET | Freq: Three times a day (TID) | ORAL | 1 refills | Status: DC | PRN

## 2022-04-03 MED ORDER — BUSPIRONE HCL 10 MG PO TABS: 10.0000 mg | ORAL_TABLET | Freq: Three times a day (TID) | ORAL | 1 refills | Status: DC

## 2022-04-03 NOTE — Patient Instructions (Addendum)
[email or web address]  [email or web address]  Elizabeth Hull [phone]   Buspirone Tablets What is this medication? BUSPIRONE (byoo SPYE rone) treats anxiety. It works by balancing the levels of dopamine and serotonin in your brain, hormones that help regulate mood. This medicine may be used for other purposes; ask your health care provider or pharmacist if you have questions. COMMON BRAND NAME(S): BuSpar, Buspar Dividose What should I tell my care team before I take this medication? They need to know if you have any of these conditions: Kidney or liver disease An unusual or allergic reaction to buspirone, other medications, foods, dyes, or preservatives Pregnant or trying to get pregnant Breast-feeding How should I use this medication? Take this medication by mouth with a glass of water. Follow the directions on the prescription label. You may take this medication with or without food. To ensure that this medication always works the same way for you, you should take it either always with or always without food. Take your doses at regular intervals. Do not take your medication more often than directed. Do not stop taking except on the advice of your care team. Talk to your care team about the use of this medication in children. Special care may be needed. Overdosage: If you think you have taken too much of this medicine contact a poison control center or emergency room at once. NOTE: This medicine is only for you. Do not share this medicine with others. What if I miss a dose? If you miss a dose, take it as soon as you can. If it is almost time for your next dose, take only that dose. Do not take double or extra doses. What may interact with this medication? Do not take this medication with any of the following: Linezolid MAOIs like Carbex, Eldepryl, Marplan, Nardil, and Parnate Methylene blue Procarbazine This medication may also interact with the  following: Diazepam Digoxin Diltiazem Erythromycin Grapefruit juice Haloperidol Medications for mental depression or mood problems Medications for seizures like carbamazepine, phenobarbital and phenytoin Nefazodone Other medications for anxiety Rifampin Ritonavir Some antifungal medications like itraconazole, ketoconazole, and voriconazole Verapamil Warfarin This list may not describe all possible interactions. Give your health care provider a list of all the medicines, herbs, non-prescription drugs, or dietary supplements you use. Also tell them if you smoke, drink alcohol, or use illegal drugs. Some items may interact with your medicine. What should I watch for while using this medication? Visit your care team for regular checks on your progress. It may take 1 to 2 weeks before your anxiety gets better. You may get drowsy or dizzy. Do not drive, use machinery, or do anything that needs mental alertness until you know how this medication affects you. Do not stand or sit up quickly, especially if you are an older patient. This reduces the risk of dizzy or fainting spells. Alcohol can make you more drowsy and dizzy. Avoid alcoholic drinks. What side effects may I notice from receiving this medication? Side effects that you should report to your care team as soon as possible: Allergic reactions--skin rash, itching, hives, swelling of the face, lips, tongue, or throat Irritability, confusion, fast or irregular heartbeat, muscle stiffness, twitching muscles, sweating, high fever, seizure, chills, vomiting, diarrhea, which may be signs of serotonin syndrome Side effects that usually do not require medical attention (report to your care team if they continue or are bothersome): Anxiety or nervousness Dizziness Drowsiness Headache Nausea Trouble sleeping This list may not describe all possible side effects.  Call your doctor for medical advice about side effects. You may report side effects to  FDA at 1-800-FDA-1088. Where should I keep my medication? Keep out of the reach of children. Store at room temperature below 30 degrees C (86 degrees F). Protect from light. Keep container tightly closed. Throw away any unused medication after the expiration date. NOTE: This sheet is a summary. It may not cover all possible information. If you have questions about this medicine, talk to your doctor, pharmacist, or health care provider.  2023 Elsevier/Gold Standard (2021-01-16 00:00:00) Hydroxyzine Capsules or Tablets What is this medication? HYDROXYZINE (hye DROX i zeen) treats the symptoms of allergies and allergic reactions. It may also be used to treat anxiety or cause drowsiness before a procedure. It works by blocking histamine, a substance released by the body during an allergic reaction. It belongs to a group of medications called antihistamines. This medicine may be used for other purposes; ask your health care provider or pharmacist if you have questions. COMMON BRAND NAME(S): ANX, Atarax, Rezine, Vistaril What should I tell my care team before I take this medication? They need to know if you have any of these conditions: Glaucoma Heart disease History of irregular heartbeat Kidney disease Liver disease Lung or breathing disease, like asthma Stomach or intestine problems Thyroid disease Trouble passing urine An unusual or allergic reaction to hydroxyzine, cetirizine, other medications, foods, dyes or preservatives Pregnant or trying to get pregnant Breast-feeding How should I use this medication? Take this medication by mouth with a full glass of water. Follow the directions on the prescription label. You may take this medication with food or on an empty stomach. Take your medication at regular intervals. Do not take your medication more often than directed. Talk to your care team regarding the use of this medication in children. Special care may be needed. While this medication  may be prescribed for children as young as 81 years of age for selected conditions, precautions do apply. Patients over 53 years old may have a stronger reaction and need a smaller dose. Overdosage: If you think you have taken too much of this medicine contact a poison control center or emergency room at once. NOTE: This medicine is only for you. Do not share this medicine with others. What if I miss a dose? If you miss a dose, take it as soon as you can. If it is almost time for your next dose, take only that dose. Do not take double or extra doses. What may interact with this medication? Do not take this medication with any of the following: Cisapride Dronedarone Pimozide Thioridazine This medication may also interact with the following: Alcohol Antihistamines for allergy, cough, and cold Atropine Barbiturate medications for sleep or seizures, like phenobarbital Certain antibiotics like erythromycin or clarithromycin Certain medications for anxiety or sleep Certain medications for bladder problems like oxybutynin, tolterodine Certain medications for depression or psychotic disturbances Certain medications for irregular heart beat Certain medications for Parkinson's disease like benztropine, trihexyphenidyl Certain medications for seizures like phenobarbital, primidone Certain medications for stomach problems like dicyclomine, hyoscyamine Certain medications for travel sickness like scopolamine Ipratropium Narcotic medications for pain Other medications that prolong the QT interval (which can cause an abnormal heart rhythm) like dofetilide This list may not describe all possible interactions. Give your health care provider a list of all the medicines, herbs, non-prescription drugs, or dietary supplements you use. Also tell them if you smoke, drink alcohol, or use illegal drugs. Some items may interact  with your medicine. What should I watch for while using this medication? Tell your  care team if your symptoms do not improve. You may get drowsy or dizzy. Do not drive, use machinery, or do anything that needs mental alertness until you know how this medication affects you. Do not stand or sit up quickly, especially if you are an older patient. This reduces the risk of dizzy or fainting spells. Alcohol may interfere with the effect of this medication. Avoid alcoholic drinks. Your mouth may get dry. Chewing sugarless gum or sucking hard candy, and drinking plenty of water may help. Contact your care team if the problem does not go away or is severe. This medication may cause dry eyes and blurred vision. If you wear contact lenses you may feel some discomfort. Lubricating drops may help. See your eye care specialist if the problem does not go away or is severe. If you are receiving skin tests for allergies, tell your care team you are using this medication. What side effects may I notice from receiving this medication? Side effects that you should report to your care team as soon as possible: Allergic reactions--skin rash, itching, hives, swelling of the face, lips, tongue, or throat Heart rhythm changes--fast or irregular heartbeat, dizziness, feeling faint or lightheaded, chest pain, trouble breathing Side effects that usually do not require medical attention (report to your care team if they continue or are bothersome): Confusion Drowsiness Dry mouth Hallucinations Headache This list may not describe all possible side effects. Call your doctor for medical advice about side effects. You may report side effects to FDA at 1-800-FDA-1088. Where should I keep my medication? Keep out of the reach of children and pets. Store at room temperature between 15 and 30 degrees C (59 and 86 degrees F). Keep container tightly closed. Throw away any unused medication after the expiration date. NOTE: This sheet is a summary. It may not cover all possible information. If you have questions about  this medicine, talk to your doctor, pharmacist, or health care provider.  2023 Elsevier/Gold Standard (2021-01-01 00:00:00)
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
## 2022-04-06 ENCOUNTER — Telehealth: Payer: Self-pay

## 2022-04-06 NOTE — Telephone Encounter (Signed)
prior auth waa approved from 04-06-22 to 04-07-23

## 2022-04-06 NOTE — Telephone Encounter (Signed)
went online to [email or web address] and submitted the prior auth .  Prior auth submitted and is pending.

## 2022-04-06 NOTE — Telephone Encounter (Signed)
received fax from pharmacy that a prior auth was needed for hydroxyzine

## 2022-04-21 DIAGNOSIS — F419 Anxiety disorder, unspecified: Secondary | ICD-10-CM | POA: Diagnosis not present

## 2022-04-21 DIAGNOSIS — Z299 Encounter for prophylactic measures, unspecified: Secondary | ICD-10-CM | POA: Diagnosis not present

## 2022-04-21 DIAGNOSIS — Z713 Dietary counseling and surveillance: Secondary | ICD-10-CM | POA: Diagnosis not present

## 2022-04-21 DIAGNOSIS — F32A Depression, unspecified: Secondary | ICD-10-CM | POA: Diagnosis not present

## 2022-04-21 DIAGNOSIS — Z6831 Body mass index (BMI) 31.0-31.9, adult: Secondary | ICD-10-CM | POA: Diagnosis not present

## 2022-04-27 ENCOUNTER — Ambulatory Visit (INDEPENDENT_AMBULATORY_CARE_PROVIDER_SITE_OTHER): Payer: Medicare Other | Admitting: Psychiatry

## 2022-04-27 ENCOUNTER — Encounter: Payer: Self-pay | Admitting: Psychiatry

## 2022-04-27 VITALS — BP 123/72 | HR 88 | Temp 97.8°F | Wt 177.8 lb

## 2022-04-27 DIAGNOSIS — F3341 Major depressive disorder, recurrent, in partial remission: Secondary | ICD-10-CM

## 2022-04-27 DIAGNOSIS — F411 Generalized anxiety disorder: Secondary | ICD-10-CM

## 2022-04-27 MED ORDER — BUSPIRONE HCL 15 MG PO TABS: 15.0000 mg | ORAL_TABLET | Freq: Two times a day (BID) | ORAL | 0 refills | Status: DC

## 2022-04-27 MED ORDER — TRAZODONE HCL 50 MG PO TABS: 50.0000 mg | ORAL_TABLET | Freq: Every day | ORAL | 0 refills | Status: DC

## 2022-04-27 MED ORDER — HYDROXYZINE HCL 25 MG PO TABS: 12.5000 mg | ORAL_TABLET | Freq: Three times a day (TID) | ORAL | 0 refills | Status: DC | PRN

## 2022-04-27 MED ORDER — CITALOPRAM HYDROBROMIDE 40 MG PO TABS: 40.0000 mg | ORAL_TABLET | Freq: Every day | ORAL | 0 refills | Status: DC

## 2022-04-28 ENCOUNTER — Ambulatory Visit: Payer: Medicare Other | Admitting: Psychiatry

## 2022-06-18 DIAGNOSIS — M940 Chondrocostal junction syndrome [Tietze]: Secondary | ICD-10-CM | POA: Diagnosis not present

## 2022-06-18 DIAGNOSIS — Z299 Encounter for prophylactic measures, unspecified: Secondary | ICD-10-CM | POA: Diagnosis not present

## 2022-06-18 DIAGNOSIS — B029 Zoster without complications: Secondary | ICD-10-CM | POA: Diagnosis not present

## 2022-06-24 ENCOUNTER — Encounter: Payer: Self-pay | Admitting: Psychiatry

## 2022-06-24 ENCOUNTER — Ambulatory Visit (INDEPENDENT_AMBULATORY_CARE_PROVIDER_SITE_OTHER): Payer: Medicare Other | Admitting: Psychiatry

## 2022-06-24 VITALS — BP 131/75 | HR 73 | Ht 63.0 in | Wt 175.0 lb

## 2022-06-24 DIAGNOSIS — F411 Generalized anxiety disorder: Secondary | ICD-10-CM

## 2022-06-24 DIAGNOSIS — F3341 Major depressive disorder, recurrent, in partial remission: Secondary | ICD-10-CM

## 2022-06-24 MED ORDER — TRAZODONE HCL 50 MG PO TABS: 50.0000 mg | ORAL_TABLET | Freq: Every day | ORAL | 0 refills | Status: DC

## 2022-06-24 MED ORDER — BUSPIRONE HCL 15 MG PO TABS: 15.0000 mg | ORAL_TABLET | ORAL | 0 refills | Status: DC

## 2022-06-24 MED ORDER — CITALOPRAM HYDROBROMIDE 40 MG PO TABS: 40.0000 mg | ORAL_TABLET | Freq: Every day | ORAL | 0 refills | Status: DC

## 2022-06-24 MED ORDER — HYDROXYZINE HCL 25 MG PO TABS: 12.5000 mg | ORAL_TABLET | Freq: Three times a day (TID) | ORAL | 0 refills | Status: DC | PRN

## 2022-06-24 NOTE — Progress Notes (Unsigned)
[facility] MD OP Progress Note  06/24/2022 3:37 PM Elizabeth Hull  MRN:  [id number]  Chief Complaint:  Chief Complaint  Patient presents with   Follow-up: 71 year old Caucasian female who has a history of anxiety, depression, presented for medication management.   HPI: Elizabeth Hull is a 71 year old Caucasian female, married, retired, has a history of generalized anxiety disorder, MDD, lives in [place], presented for medication management.  Patient today reports overall she has been doing fairly well with regards to her depression and anxiety symptoms.  She has had more good days than bad days.  Last week she had one bad day when she could not get out of her couch.  She however reports prior to that she was busy taking care of her stepdaughters children, 65-month-old and 3 years old and was pretty busy.  That could have triggered it.  However the next day she felt much better.  Patient reports she continues to have anxiety mostly due to situational stressors.  She has not been able to find a therapist yet.  Continues to work on it.  Currently compliant on her medications like BuSpar and Celexa.  Denies side effects.  Does report sleep as restless mostly because of external factors.  Her husband uses a CPAP and that does keep her awake at night.  Patient also reports other noises and sounds and distractions from her pet and so on.  She is agreeable to start working on her sleep hygiene.  She has never had a sleep study done before.  Patient currently denies any suicidality, homicidality or perceptual disturbances.  Patient denies any other concerns today.  Visit Diagnosis:    ICD-10-CM   1. GAD (generalized anxiety disorder)  F41.1 busPIRone (BUSPAR) 15 MG tablet    traZODone (DESYREL) 50 MG tablet    hydrOXYzine (ATARAX) 25 MG tablet    citalopram (CELEXA) 40 MG tablet    2. MDD (major depressive disorder), recurrent, in partial remission (HCC)  F33.41 busPIRone (BUSPAR) 15 MG tablet    traZODone  (DESYREL) 50 MG tablet    citalopram (CELEXA) 40 MG tablet      Past Psychiatric History: Reviewed past psychiatric history from progress note on 04/03/2022.  Past trials of Elavil, Xanax, BuSpar.  Past Medical History:  Past Medical History:  Diagnosis Date   Anxiety    Arthritis    Cervical spinal stenosis    Complication of anesthesia    Depression    HNP (herniated nucleus pulposus), cervical    PONV (postoperative nausea and vomiting)    Thyroid condition     Past Surgical History:  Procedure Laterality Date   ANTERIOR CERVICAL DECOMP/DISCECTOMY FUSION N/A 09/16/2018   Procedure: C4-C5 ANTERIOR CERVICAL DECOMPRESSION/DISCECTOMY FUSION, ALLOGRAFT, PLATE;  Surgeon: Eldred Manges, MD;  Location: [facility];  Service: Orthopedics;  Laterality: N/A;   BACK SURGERY  2017   lumbar   BRAIN TUMOR EXCISION     TUBAL LIGATION      Family Psychiatric History: Reviewed family psychiatric history from progress note on 04/03/2022.  Family History:  Family History  Problem Relation Age of Onset   Depression Brother    Anxiety disorder Brother    Depression Son    Anxiety disorder Son    Bipolar disorder Granddaughter     Social History: Reviewed social history from progress note on 04/03/2022. Social History   Socioeconomic History   Marital status: Married    Spouse name: Not on file   Number of children: 2  Years of education: Not on file   Highest education level: High school graduate  Occupational History   Not on file  Tobacco Use   Smoking status: Never   Smokeless tobacco: Never  Vaping Use   Vaping Use: Never used  Substance and Sexual Activity   Alcohol use: Yes    Comment: wine with dinner   Drug use: Never   Sexual activity: Yes  Other Topics Concern   Not on file  Social History Narrative   Not on file   Social Determinants of Health   Financial Resource Strain: Not on file  Food Insecurity: Not on file  Transportation Needs: Not on file  Physical  Activity: Not on file  Stress: Not on file  Social Connections: Not on file    Allergies:  Allergies  Allergen Reactions   Hydrocodone Other (See Comments)    Unknown   Meperidine Hcl Other (See Comments)    Unknown   Nucynta [Tapentadol Hcl] Other (See Comments)    Unknown   Nucynta [Tapentadol]    Other    Sulfa Antibiotics Nausea Only   Codeine Nausea Only and Nausea And Vomiting    Metabolic Disorder Labs: No results found for: "HGBA1C", "MPG" No results found for: "PROLACTIN" No results found for: "CHOL", "TRIG", "HDL", "CHOLHDL", "VLDL", "LDLCALC" No results found for: "TSH"  Therapeutic Level Labs: No results found for: "LITHIUM" No results found for: "VALPROATE" No results found for: "CBMZ"  Current Medications: Current Outpatient Medications  Medication Sig Dispense Refill   Apple Cid Vn-Grn Tea-Bit Or-Cr (APPLE CIDER VINEGAR PLUS PO) Take by mouth.     B Complex-C (SUPER B COMPLEX/VITAMIN C PO) Take by mouth.     fluticasone (FLONASE) 50 MCG/ACT nasal spray Place 2 sprays into both nostrils daily as needed for allergies or rhinitis.     gabapentin (NEURONTIN) 600 MG tablet Take 600 mg by mouth 2 (two) times daily.     KRILL OIL PO Take 1,200 mg by mouth daily.     levothyroxine (SYNTHROID) 88 MCG tablet Take 88 mcg by mouth daily.     Magnesium 250 MG TABS Take by mouth.     meloxicam (MOBIC) 15 MG tablet      Multiple Vitamin (MULTI-VITAMIN DAILY PO) Take by mouth.     Multiple Vitamins-Minerals (VITAMIN D3 COMPLETE PO) Take by mouth.     RESTASIS 0.05 % ophthalmic emulsion 1 drop 2 (two) times daily.     rosuvastatin (CRESTOR) 5 MG tablet Take 5 mg by mouth at bedtime.  3   TURMERIC CURCUMIN PO Take by mouth.     busPIRone (BUSPAR) 15 MG tablet Take 1 tablet (15 mg total) by mouth as directed. Take 1 tablet daily AM and 2 tablets PM 270 tablet 0   citalopram (CELEXA) 40 MG tablet Take 1 tablet (40 mg total) by mouth daily. 90 tablet 0   hydrOXYzine  (ATARAX) 25 MG tablet Take 0.5-1 tablets (12.5-25 mg total) by mouth 3 (three) times daily as needed. For severe anxiety attacks only 270 tablet 0   traZODone (DESYREL) 50 MG tablet Take 1 tablet (50 mg total) by mouth at bedtime. 90 tablet 0   valACYclovir (VALTREX) 1000 MG tablet Take 1,000 mg by mouth 3 (three) times daily.     No current facility-administered medications for this visit.     Musculoskeletal: Strength & Muscle Tone: within normal limits Gait & Station: normal Patient leans: N/A  Psychiatric Specialty Exam: Review of Systems  Psychiatric/Behavioral:  Positive for sleep disturbance. The patient is nervous/anxious.   All other systems reviewed and are negative.   Blood pressure 131/75, pulse 73, height 5\' 3"  (1.6 m), weight 175 lb (79.4 kg).Body mass index is 31 kg/m.  General Appearance: Casual  Eye Contact:  Fair  Speech:  Clear and Coherent  Volume:  Normal  Mood:  Anxious  Affect:  Congruent  Thought Process:  Goal Directed and Descriptions of Associations: Intact  Orientation:  Full (Time, Place, and Person)  Thought Content: Logical   Suicidal Thoughts:  No  Homicidal Thoughts:  No  Memory:  Immediate;   Fair Recent;   Fair Remote;   Fair  Judgement:  Fair  Insight:  Fair  Psychomotor Activity:  Normal  Concentration:  Concentration: Fair and Attention Span: Fair  Recall:  of Knowledge: Fair  Language: Fair  Akathisia:  No  Handed:  Right  AIMS (if indicated): not done  Assets:  Communication Skills Desire for Improvement Housing Social Support  ADL's:  Intact  Cognition: WNL  Sleep:  Poor due to external factors   Screenings: AIMS    Flowsheet Row Office Visit from 04/03/2022 in [facility]  AIMS Total Score 0      GAD-7    Flowsheet Row Office Visit from 06/24/2022 in [facility] Office Visit from 04/03/2022 in [facility]  Total GAD-7  Score 10 13      PHQ2-9    Flowsheet Row Office Visit from 06/24/2022 in [facility] Office Visit from 04/27/2022 in [facility] Office Visit from 04/03/2022 in [facility]  PHQ-2 Total Score 4 0 6  PHQ-9 Total Score 14 7 24       Flowsheet Row Office Visit from 06/24/2022 in [facility] Office Visit from 04/27/2022 in [facility] Office Visit from 04/03/2022 in [facility]  C-SSRS RISK CATEGORY No Risk No Risk Low Risk        Assessment and Plan: Pamelyn Bancroft is a 71 year old Caucasian female who has a history of depression, anxiety, hypothyroidism, degenerative disc disease, history of brain tumor status post surgery, presented for medication management.  Patient is currently improving although will continue to benefit from medication changes, will benefit from the following plan.  Plan GAD-improving Celexa 40 mg p.o. daily Increase BuSpar to 15 mg daily in the morning and 30 mg daily in the evening. Hydroxyzine 12.5-25 mg p.o. 3 times daily as needed for anxiety attacks Patient to establish care with a therapist.  MDD-improving BuSpar as prescribed Celexa 40 mg p.o. daily Gabapentin 1200 mg p.o. daily in divided dosage for neuropathy which is also helpful with her mood. Patient advised to work on sleep hygiene techniques.  Patient could also sleep in a separate room since she is distracted by the sound of her husband's CPAP device .  Follow-up in clinic in 6 to 8 weeks or sooner if needed.  This note was generated in part or whole with voice recognition software. Voice recognition is usually quite accurate but there are transcription errors that can and very often do occur. I apologize for any typographical errors that were not detected and corrected.        80, MD 06/25/2022, 2:46 PM

## 2022-07-24 DIAGNOSIS — Z23 Encounter for immunization: Secondary | ICD-10-CM | POA: Diagnosis not present

## 2022-08-11 DIAGNOSIS — F339 Major depressive disorder, recurrent, unspecified: Secondary | ICD-10-CM | POA: Diagnosis not present

## 2022-08-11 DIAGNOSIS — Z299 Encounter for prophylactic measures, unspecified: Secondary | ICD-10-CM | POA: Diagnosis not present

## 2022-08-11 DIAGNOSIS — R350 Frequency of micturition: Secondary | ICD-10-CM | POA: Diagnosis not present

## 2022-08-11 DIAGNOSIS — M940 Chondrocostal junction syndrome [Tietze]: Secondary | ICD-10-CM | POA: Diagnosis not present

## 2022-09-02 ENCOUNTER — Encounter: Payer: Self-pay | Admitting: Psychiatry

## 2022-09-02 ENCOUNTER — Ambulatory Visit (INDEPENDENT_AMBULATORY_CARE_PROVIDER_SITE_OTHER): Payer: Medicare Other | Admitting: Psychiatry

## 2022-09-02 VITALS — BP 135/65 | HR 90 | Temp 97.8°F | Ht 63.0 in | Wt 179.0 lb

## 2022-09-02 DIAGNOSIS — R635 Abnormal weight gain: Secondary | ICD-10-CM | POA: Diagnosis not present

## 2022-09-02 DIAGNOSIS — F3341 Major depressive disorder, recurrent, in partial remission: Secondary | ICD-10-CM

## 2022-09-02 DIAGNOSIS — F411 Generalized anxiety disorder: Secondary | ICD-10-CM | POA: Diagnosis not present

## 2022-09-02 MED ORDER — CITALOPRAM HYDROBROMIDE 40 MG PO TABS: 40.0000 mg | ORAL_TABLET | Freq: Every day | ORAL | 0 refills | Status: DC

## 2022-09-02 MED ORDER — TRAZODONE HCL 50 MG PO TABS: 50.0000 mg | ORAL_TABLET | Freq: Every day | ORAL | 0 refills | Status: DC

## 2022-09-02 MED ORDER — BUSPIRONE HCL 15 MG PO TABS: 15.0000 mg | ORAL_TABLET | ORAL | 0 refills | Status: DC

## 2022-09-02 NOTE — Progress Notes (Unsigned)
[place] MD OP Progress Note  09/02/2022 12:50 PM Elizabeth Hull  MRN:  [id number]  Chief Complaint:  Chief Complaint  Patient presents with   Follow-up   Anxiety   Depression   HPI: Elizabeth Hull is a 71 year old Caucasian female, married, retired, has a history of generalized anxiety disorder, MDD, lives in [place], presented for medication management.  Patient today reports she was recently started on Wellbutrin, 3 to 4 weeks ago by her primary care provider.  This was added for weight loss and appetite suppression.  She has not lost any weight and does not believe the appetite has changed to either.  She reports she continues to have episodes when she uses food as a comfort, does it twice a week or so.  She reports she can finish up a whole bag of pretzels when she feels that way.  She reports she was trying to get on a weight loss medication like Mounjaro however her primary provider did not feel she met criteria.  Patient reports overall depression symptoms may have improved.  She has been doing more activities, getting out more, joining the church and is motivated to continue to be part of the church community.  Patient reports she does have some days couple of times a week when she wakes up and feels as though she does not want to do anything.  She has not been able to find a therapist yet.  Patient reports anxiety symptoms are more under control.  Currently compliant on medications as prescribed.  Side effects to medications.  Denies any sleep problems.  Denies any suicidality, homicidality or perceptual disturbances.    Visit Diagnosis:    ICD-10-CM   1. GAD (generalized anxiety disorder)  F41.1 citalopram (CELEXA) 40 MG tablet    busPIRone (BUSPAR) 15 MG tablet    traZODone (DESYREL) 50 MG tablet    2. MDD (major depressive disorder), recurrent, in partial remission (HCC)  F33.41 citalopram (CELEXA) 40 MG tablet    busPIRone (BUSPAR) 15 MG tablet    traZODone (DESYREL) 50 MG tablet     3. Weight gain  R63.5 Amb Ref to Medical Weight Management      Past Psychiatric History: Reviewed past psychiatric history from progress note on 04/03/2022.  Past trials of Elavil, Xanax, BuSpar.  Past Medical History:  Past Medical History:  Diagnosis Date   Anxiety    Arthritis    Cervical spinal stenosis    Complication of anesthesia    Depression    HNP (herniated nucleus pulposus), cervical    PONV (postoperative nausea and vomiting)    Thyroid condition     Past Surgical History:  Procedure Laterality Date   ANTERIOR CERVICAL DECOMP/DISCECTOMY FUSION N/A 09/16/2018   Procedure: C4-C5 ANTERIOR CERVICAL DECOMPRESSION/DISCECTOMY FUSION, ALLOGRAFT, PLATE;  Surgeon: Marybelle Killings, MD;  Location: [place];  Service: Orthopedics;  Laterality: N/A;   BACK SURGERY  2017   lumbar   BRAIN TUMOR EXCISION     TUBAL LIGATION      Family Psychiatric History: Reviewed family psychiatric history from progress note on 04/03/2022.  Family History:  Family History  Problem Relation Age of Onset   Depression Brother    Anxiety disorder Brother    Depression Son    Anxiety disorder Son    Bipolar disorder Granddaughter     Social History: Reviewed social history from progress note on 04/03/2022. Social History   Socioeconomic History   Marital status: Married    Spouse name: Not  on file   Number of children: 2   Years of education: Not on file   Highest education level: High school graduate  Occupational History   Not on file  Tobacco Use   Smoking status: Never   Smokeless tobacco: Never  Vaping Use   Vaping Use: Never used  Substance and Sexual Activity   Alcohol use: Yes    Comment: wine with dinner   Drug use: Never   Sexual activity: Yes  Other Topics Concern   Not on file  Social History Narrative   Not on file   Social Determinants of Health   Financial Resource Strain: Not on file  Food Insecurity: Not on file  Transportation Needs: Not on file  Physical  Activity: Not on file  Stress: Not on file  Social Connections: Not on file    Allergies:  Allergies  Allergen Reactions   Hydrocodone Other (See Comments)    Unknown   Meperidine Hcl Other (See Comments)    Unknown   Nucynta [Tapentadol Hcl] Other (See Comments)    Unknown   Nucynta [Tapentadol]    Other    Sulfa Antibiotics Nausea Only   Codeine Nausea Only and Nausea And Vomiting    Metabolic Disorder Labs: No results found for: "HGBA1C", "MPG" No results found for: "PROLACTIN" No results found for: "CHOL", "TRIG", "HDL", "CHOLHDL", "VLDL", "LDLCALC" No results found for: "TSH"  Therapeutic Level Labs: No results found for: "LITHIUM" No results found for: "VALPROATE" No results found for: "CBMZ"  Current Medications: Current Outpatient Medications  Medication Sig Dispense Refill   Apple Cid Vn-Grn Tea-Bit Or-Cr (APPLE CIDER VINEGAR PLUS PO) Take by mouth.     B Complex-C (SUPER B COMPLEX/VITAMIN C PO) Take by mouth.     buPROPion (WELLBUTRIN XL) 150 MG 24 hr tablet Take 150 mg by mouth daily.     fluticasone (FLONASE) 50 MCG/ACT nasal spray Place 2 sprays into both nostrils daily as needed for allergies or rhinitis.     gabapentin (NEURONTIN) 600 MG tablet Take 600 mg by mouth 2 (two) times daily.     hydrOXYzine (ATARAX) 25 MG tablet Take 0.5-1 tablets (12.5-25 mg total) by mouth 3 (three) times daily as needed. For severe anxiety attacks only 270 tablet 0   KRILL OIL PO Take 1,200 mg by mouth daily.     levothyroxine (SYNTHROID) 88 MCG tablet Take 88 mcg by mouth daily.     Magnesium 250 MG TABS Take by mouth.     Multiple Vitamin (MULTI-VITAMIN DAILY PO) Take by mouth.     Multiple Vitamins-Minerals (VITAMIN D3 COMPLETE PO) Take by mouth.     RESTASIS 0.05 % ophthalmic emulsion 1 drop 2 (two) times daily.     rosuvastatin (CRESTOR) 5 MG tablet Take 5 mg by mouth at bedtime.  3   TURMERIC CURCUMIN PO Take by mouth.     valACYclovir (VALTREX) 1000 MG tablet Take  1,000 mg by mouth 3 (three) times daily.     busPIRone (BUSPAR) 15 MG tablet Take 1 tablet (15 mg total) by mouth as directed. Take 1 tablet daily AM and 2 tablets PM 270 tablet 0   citalopram (CELEXA) 40 MG tablet Take 1 tablet (40 mg total) by mouth daily. 90 tablet 0   nitrofurantoin (MACRODANTIN) 100 MG capsule Take 100 mg by mouth 2 (two) times daily.     predniSONE (STERAPRED UNI-PAK 21 TAB) 5 MG (21) TBPK tablet Take by mouth as directed.  traZODone (DESYREL) 50 MG tablet Take 1 tablet (50 mg total) by mouth at bedtime. 90 tablet 0   No current facility-administered medications for this visit.     Musculoskeletal: Strength & Muscle Tone: within normal limits Gait & Station: normal Patient leans: N/A  Psychiatric Specialty Exam: Review of Systems  Psychiatric/Behavioral:  The patient is nervous/anxious.        Lack of motivation some day  All other systems reviewed and are negative.   Blood pressure 135/65, pulse 90, temperature 97.8 F (36.6 C), temperature source Oral, height _0  (1.6 m), weight 179 lb (81.2 kg).Body mass index is 31.71 kg/m.  General Appearance: Casual  Eye Contact:  Fair  Speech:  Clear and Coherent  Volume:  Normal  Mood:  Anxious, lack of motivation some days  Affect:  Congruent  Thought Process:  Goal Directed and Descriptions of Associations: Intact  Orientation:  Full (Time, Place, and Person)  Thought Content: Logical   Suicidal Thoughts:  No  Homicidal Thoughts:  No  Memory:  Immediate;   Fair Recent;   Fair Remote;   Fair  Judgement:  Fair  Insight:  Fair  Psychomotor Activity:  Normal  Concentration:  Concentration: Fair and Attention Span: Fair  Recall:  [employer] of Knowledge: Fair  Language: Fair  Akathisia:  No  Handed:  Right  AIMS (if indicated): not done  Assets:  Communication Skills Desire for Improvement Housing Social Support  ADL's:  Intact  Cognition: WNL  Sleep:  Fair   Screenings: [place] Office Visit from 04/03/2022 in [place] Total Score 0      [place] Visit from 06/24/2022 in [place] Office Visit from 04/03/2022 in [place]  Total GAD-7 Score 10 13      PHQ2-9    [place] Visit from 06/24/2022 in [place] Visit from 04/27/2022 in [place] Visit from 04/03/2022 in [place]  PHQ-2 Total Score 4 0 6  PHQ-9 Total Score _1 [place] Office Visit from 06/24/2022 in [place] Office Visit from 04/27/2022 in [place] Office Visit from 04/03/2022 in [place] No Risk No Risk Low Risk        Assessment and Plan: Stanisha Lorenz is a 71 year old Caucasian female who has a history of depression, anxiety, hypothyroidism, degenerative disc disease, history of brain tumor status post surgery, presented for medication management.  Patient is currently improving although she continues to have motivation problems, increased appetite some days, will benefit from the following plan.  Patient also struggles with her weight and may benefit from referral to weight loss clinic.  Plan  GAD-improving Celexa 40 mg p.o. daily BuSpar 15 mg p.o. daily in the morning and 30 mg p.o. daily in the evening Hydroxyzine 12.5-25 mg p.o. 3 times daily as needed for anxiety attacks Patient to establish care with a therapist-I have communicated with our staff to schedule her with our new therapist.  MDD-in partial remission BuSpar as prescribed Celexa 40 mg p.o. daily Gabapentin 1200 mg p.o. daily in divided dosage Continue Wellbutrin XL 150 mg-recently added by primary care provider for weight loss. Trazodone 50 mg p.o.  nightly. Discussed drug to drug interaction including serotonin syndrome, patient currently on multiple  psychotropics.  Weight gain-unstable Will refer for weight loss clinic evaluation and management.  I have put a referral in.  Patient also provided a phone number to contact them.    Follow-up in clinic in 2 months or sooner if needed.   This note was generated in part or whole with voice recognition software. Voice recognition is usually quite accurate but there are transcription errors that can and very often do occur. I apologize for any typographical errors that were not detected and corrected.    Ursula Alert, MD 09/02/2022, 12:50 PM

## 2022-09-02 NOTE — Patient Instructions (Signed)
[facility] [place], [phone]

## 2022-09-09 DIAGNOSIS — E7800 Pure hypercholesterolemia, unspecified: Secondary | ICD-10-CM | POA: Diagnosis not present

## 2022-09-09 DIAGNOSIS — E039 Hypothyroidism, unspecified: Secondary | ICD-10-CM | POA: Diagnosis not present

## 2022-09-09 DIAGNOSIS — Z1331 Encounter for screening for depression: Secondary | ICD-10-CM | POA: Diagnosis not present

## 2022-09-09 DIAGNOSIS — Z6831 Body mass index (BMI) 31.0-31.9, adult: Secondary | ICD-10-CM | POA: Diagnosis not present

## 2022-09-09 DIAGNOSIS — Z0000 Encounter for general adult medical examination without abnormal findings: Secondary | ICD-10-CM | POA: Diagnosis not present

## 2022-09-09 DIAGNOSIS — Z7189 Other specified counseling: Secondary | ICD-10-CM | POA: Diagnosis not present

## 2022-09-09 DIAGNOSIS — R5383 Other fatigue: Secondary | ICD-10-CM | POA: Diagnosis not present

## 2022-09-09 DIAGNOSIS — Z299 Encounter for prophylactic measures, unspecified: Secondary | ICD-10-CM | POA: Diagnosis not present

## 2022-09-09 DIAGNOSIS — Z1339 Encounter for screening examination for other mental health and behavioral disorders: Secondary | ICD-10-CM | POA: Diagnosis not present

## 2022-09-10 DIAGNOSIS — Z79899 Other long term (current) drug therapy: Secondary | ICD-10-CM | POA: Diagnosis not present

## 2022-09-10 DIAGNOSIS — E039 Hypothyroidism, unspecified: Secondary | ICD-10-CM | POA: Diagnosis not present

## 2022-09-10 DIAGNOSIS — E7800 Pure hypercholesterolemia, unspecified: Secondary | ICD-10-CM | POA: Diagnosis not present

## 2022-09-10 DIAGNOSIS — R5383 Other fatigue: Secondary | ICD-10-CM | POA: Diagnosis not present

## 2022-09-14 ENCOUNTER — Encounter (INDEPENDENT_AMBULATORY_CARE_PROVIDER_SITE_OTHER): Payer: Self-pay

## 2022-10-06 DIAGNOSIS — E2839 Other primary ovarian failure: Secondary | ICD-10-CM | POA: Diagnosis not present

## 2022-10-08 ENCOUNTER — Other Ambulatory Visit: Payer: Self-pay | Admitting: Internal Medicine

## 2022-10-08 DIAGNOSIS — Z1231 Encounter for screening mammogram for malignant neoplasm of breast: Secondary | ICD-10-CM

## 2022-10-12 ENCOUNTER — Ambulatory Visit
Admission: RE | Admit: 2022-10-12 | Discharge: 2022-10-12 | Disposition: A | Discharge status: Home / Self Care | Payer: Medicare Other | Source: Ambulatory Visit | Attending: Internal Medicine | Admitting: Internal Medicine

## 2022-10-12 ENCOUNTER — Other Ambulatory Visit: Payer: Self-pay | Admitting: Internal Medicine

## 2022-10-12 DIAGNOSIS — Z1231 Encounter for screening mammogram for malignant neoplasm of breast: Secondary | ICD-10-CM

## 2022-10-14 ENCOUNTER — Ambulatory Visit (INDEPENDENT_AMBULATORY_CARE_PROVIDER_SITE_OTHER): Payer: Medicare Other | Admitting: Licensed Clinical Social Worker

## 2022-10-14 DIAGNOSIS — F3341 Major depressive disorder, recurrent, in partial remission: Secondary | ICD-10-CM

## 2022-10-14 DIAGNOSIS — F411 Generalized anxiety disorder: Secondary | ICD-10-CM
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
## 2022-10-23 DIAGNOSIS — Z713 Dietary counseling and surveillance: Secondary | ICD-10-CM | POA: Diagnosis not present

## 2022-10-23 DIAGNOSIS — M546 Pain in thoracic spine: Secondary | ICD-10-CM | POA: Diagnosis not present

## 2022-10-23 DIAGNOSIS — M4804 Spinal stenosis, thoracic region: Secondary | ICD-10-CM | POA: Diagnosis not present

## 2022-10-23 DIAGNOSIS — Z299 Encounter for prophylactic measures, unspecified: Secondary | ICD-10-CM | POA: Diagnosis not present

## 2022-10-23 DIAGNOSIS — Z6831 Body mass index (BMI) 31.0-31.9, adult: Secondary | ICD-10-CM | POA: Diagnosis not present

## 2022-10-23 DIAGNOSIS — M47814 Spondylosis without myelopathy or radiculopathy, thoracic region: Secondary | ICD-10-CM | POA: Diagnosis not present

## 2022-11-03 ENCOUNTER — Ambulatory Visit: Payer: Medicare Other | Admitting: Licensed Clinical Social Worker

## 2022-11-04 ENCOUNTER — Ambulatory Visit: Payer: Medicare Other | Admitting: Psychiatry

## 2022-11-04 DIAGNOSIS — J069 Acute upper respiratory infection, unspecified: Secondary | ICD-10-CM | POA: Diagnosis not present

## 2022-11-04 DIAGNOSIS — R062 Wheezing: Secondary | ICD-10-CM | POA: Diagnosis not present

## 2022-11-04 DIAGNOSIS — Z299 Encounter for prophylactic measures, unspecified: Secondary | ICD-10-CM | POA: Diagnosis not present

## 2022-11-12 DIAGNOSIS — J0140 Acute pansinusitis, unspecified: Secondary | ICD-10-CM | POA: Diagnosis not present

## 2022-11-19 ENCOUNTER — Ambulatory Visit (INDEPENDENT_AMBULATORY_CARE_PROVIDER_SITE_OTHER): Payer: Medicare Other | Admitting: Licensed Clinical Social Worker

## 2022-11-19 DIAGNOSIS — F411 Generalized anxiety disorder: Secondary | ICD-10-CM | POA: Diagnosis not present

## 2022-11-19 DIAGNOSIS — F3341 Major depressive disorder, recurrent, in partial remission: Secondary | ICD-10-CM | POA: Diagnosis not present

## 2022-11-19 NOTE — Progress Notes (Signed)
THERAPIST PROGRESS NOTE  Session Time: 11:00am-12:00pm  Participation Level: Active  Behavioral Response: Well GroomedAlertEuthymic  Type of Therapy: Individual Therapy  Treatment Goals addressed: Anxiety: Reduce overall frequency, intensity and duration of anxiety so that daily functioning is not impaired per pt self report 3 out of 5 sessions.   Intervention: Will work with pt to learn and implement coping skills that result in a reduction of anxiety and improve daily functioning per pt self report 3 out of 5 documented sessions   ProgressTowards Goals: Progressing  Interventions: CBT and Supportive  Pt presented in person at [facility] office. Pt and LCSW were present during the visit.    Summary: Elizabeth Hull is a 72 y.o. female who presents with continuing symptoms of anxiety. Pt stated that she worries about her husband and his job and that she has been worried about what he will do if he does not have his job. Pt reported that she feels much better and stated that her depression and mood have been good. Pt reported that she was sick over Christmas and that she is happy to no longer by sick.   Pt reported that she was able to go see her family over the last few weeks and that it was nice being around family. Pt was able to explore her coping skill and stated that she enjoys watching television and that she enjoys reading.   PHQ-9 score on 10/14/2022 was a 6 and on 11/19/2022 PHQ-9 score was a 3. GAD-7 score on 10/14/2022 was a score of 9 and 11/19/2022 GAD-7 score was a 1. Pt reported that she does worry about her children and step-children.   Allowed pt to explore thoughts and feelings associated with life situations and external stressors. Encouraged expression of feelings and used empathic listening. Pt was oriented to time, place and situation. LCSW validated the pts feelings and thoughts and showed unconditional positive regard.   LCSW provided mood  monitoring and treatment progress review in the context of this episode of treatment. LCSW reviewed the pt's mood status since last session.  Pt denies SI/HI or A/V hallucinations. Pt was cooperative during visit and was engaged throughout the visit. Pt does not report any other concerns at the time of visit.   Pt stated that she would try to use the 5-4-3-2-1 technique dicussed in session and that she would try to find new ways to cope. Pt reported that she is wanting to improve her motivation and that she would try the new techniques dicussed in session and would report back at the next session on how effective they are.   Encouraged pt to take medications as prescribed by their psychiatrist.    Discussed with the pt the transition of a new therapist and provided the pt with the choice of continuing services with the new therapist and answered any questions that the pt had about the transition.  Pt stated that she would like to continue therapy at this time and that she was fine with the transition to the new therapist after.    Suicidal/Homicidal: Nowithout intent/plan  Therapist Response: Educated on the importance of healthy coping skills with the pt. Explored the benefits of healthy coping skills and engaged the pt to discuss ways that they are coping with anxiety and depression. Encouraged the pt to explore new ways to help alleviate anxiety and determined new ways to help with distraction and coping in session. Discussed the benefits of exercise as a way of  coping with anxiety and stress. Explored different ways that the pt could cope to include listening to music, being in nature, trying a new hobby and other activities that the pt could try to help with coping.    Educated on grounding technique 5-4-3-2-1 technique to help control difficult emotions and symptoms by turning attention away from those uncomfortable thoughts, memories or worries and focusing on the present moment. Using the  5-4-3-2-1 technique to take in the details of their surrounding by using the five senses. Explored each step with the pt and engaged the pt to try the technique at home when they are facing difficult emotions. What are five things that you can see by exploring small details in their environment to include an object, the way that the light reflects off another surface or any other detail of what they see. Next exploring four things that you can feel. Noticing the sensations of how an object feels, how is the chair that they are sitting on feels, how does sun feel on your skin, tuning in to the different sensations of touch. Exploring three things that you can hear to include the sound of the wind, the sound of a clock and other surrounding sounds that are in the environment you are in. Then noticing two things that the pt can smell, to include the air around then do they smell fresh cut grass, a candle or food. Lastly, exploring taste and if there is anything they can taste to include gum, food or another flavors. Engaged with the pt to strive to notice the small details of this technique that usually they would tune out such as distant noises and objects that they see on a daily basis. LCSW encouraged the pt to explore their thoughts and feelings about the technique and how it could be helpful to allow the pt to be present and mindful in the moment.    Continued Recommendations as followed: Self-care behaviors, positive social engagements, focusing on positive physical and emotional wellness, and focusing on life/work balance.    Plan: Return again on 12/04/2022.   Diagnosis:  Encounter Diagnoses  Name Primary?   GAD (generalized anxiety disorder) Yes   MDD (major depressive disorder), recurrent, in partial remission ([place])      Collaboration of Care: Pt encouraged to continue care with psychiatrist of record Dr. Shea Evans.    Patient/Guardian was advised Release of Information must be obtained prior to  any record release in order to collaborate their care with an outside provider. Patient/Guardian was advised if they have not already done so to contact the registration department to sign all necessary forms in order for [place] to release information regarding their care.   Consent: Patient/Guardian gives verbal consent for treatment and assignment of benefits for services provided during this visit. Patient/Guardian expressed understanding and agreed to proceed.   Elizabeth Hull 11/19/2022

## 2022-11-28 ENCOUNTER — Other Ambulatory Visit: Payer: Self-pay | Admitting: Psychiatry

## 2022-11-28 DIAGNOSIS — F411 Generalized anxiety disorder: Secondary | ICD-10-CM

## 2022-11-28 DIAGNOSIS — F3341 Major depressive disorder, recurrent, in partial remission: Secondary | ICD-10-CM

## 2022-12-02 DIAGNOSIS — M5134 Other intervertebral disc degeneration, thoracic region: Secondary | ICD-10-CM | POA: Diagnosis not present

## 2022-12-04 ENCOUNTER — Ambulatory Visit (INDEPENDENT_AMBULATORY_CARE_PROVIDER_SITE_OTHER): Payer: Medicare Other | Admitting: Licensed Clinical Social Worker

## 2022-12-04 DIAGNOSIS — F411 Generalized anxiety disorder: Secondary | ICD-10-CM

## 2022-12-04 DIAGNOSIS — F3341 Major depressive disorder, recurrent, in partial remission: Secondary | ICD-10-CM

## 2022-12-04 NOTE — Progress Notes (Signed)
THERAPIST PROGRESS NOTE  Session Time: 10:00am-10:50am   Participation Level: Active  Behavioral Response: Well GroomedAlertEuthymic  Type of Therapy: Individual Therapy  Treatment Goals addressed: Anxiety: Reduce overall frequency, intensity and duration of anxiety so that daily functioning is not impaired per pt self report 3 out of 5 sessions.   Intervention: Will work with pt to learn and implement coping skills that result in a reduction of anxiety and improve daily functioning per pt self report 3 out of 5 documented sessions   Goal: Depression:Reduce overall frequency, intensity and duration of depression so that daily functioning is not impaired per pt self report 3 out of 5 sessions documented.    Intervention: Will work with pt to learn and implement calming skills to reduce overall depression and manage depression symptoms. Some of the techniques that will be used during session will be deep breathing exercises, visual imagery, progressive muscle relaxation, postreduction relative to the benefits of self-care and other breathing exercises.    PHQ-9 on 11/19/2022 was a 3 and on 12/04/2022 PHQ-9 was a 2. GAD-7 score on 11/19/2022 was a 1 and on 2/2/202  GAD-7 score was 0.   ProgressTowards Goals: Progressing  Interventions: CBT, Strength-based, and Supportive  Pt presented in person at [facility] office. Pt and LCSW were present during the visit.  LCSW Alvy Beal was present during the visit.   Summary: Elizabeth Hull is a 72 y.o. female who has a history of generalized anxiety disorder and MDD. Pt reported that her mood has been "good". Pt stated that she has been feeling good and that she has been able to spend time with her friend and that she has been having social support from her husband and family. Pt reported that she still has not went to the gym and that she is wanting to go. Pt was able to explore in session ways that she could motivate  herself and take small steps to reach her goal of getting back in the gym. Pt stated that she would consider writing it on her calender and trying to follow-through with the task of going back to the gym to see how it makes her feel.   Allowed pt to explore thoughts and feelings associated with life situations and external stressors. Encouraged expression of feelings and used empathic listening. Pt was oriented to time, place and situation. LCSW validated the pts feelings and thoughts and showed unconditional positive regard.   Pt stated that she had a day where she did overeat and was able to discuss in session how she felt. Pt was engaged in trying new activities to distract herself when she feels that she is going to eat more than she needs to. Pt stated that she will try and walk away and try other distractions when she feels the urge to overeat.   LCSW provided mood monitoring and treatment progress review in the context of this episode of treatment. LCSW reviewed the pt's mood status since last session.    Pt was able to explore her strengths in session and was able to explore how she has been coping. Pt stated that she enjoys being in nature and taking deep breaths when she is outside. Pt stated that she has been coping by watching a show that she enjoys. Pt stated that she has not felt anxious and that she has been taking it day by day and that she is trying to utilize self-care daily and do activities that bring her joy.  Pt is continuing to apply interventions/techniques learned in session into daily life situations. Pt is currently on track to meet goals utilizing interventions that are discussed in session. Treatment to continue as indicated. Personal growth and progress toward goals noted above.   Continued Recommendations as followed: Self-care behaviors, positive social engagements, focusing on positive physical and emotional wellness, and focusing on life/work balance.    Pt denies  SI/HI or A/V hallucinations. Pt was cooperative during visit and was engaged throughout the visit. Pt does not report any other concerns at the time of visit.    Encouraged pt to take medications as prescribed by their psychiatrist.       Suicidal/Homicidal: Nowithout intent/plan  Therapist Response:  Explored with pt how one important way to regain emotional health is to develop and utilize healthy social supports. Engaged the pt to explore who their support system is to include any family members, friends, other resources or social groups that they are part of feel and important to. Explored that the support must be someone or a group that they trust and affirm their individuality. Encouraged the pt to explore any barriers that stand in the way in developing their support system to include: difficult time reaching out to others, low self-esteem, hard time making and keeping friends and any other barriers that prevent them from having social supports. Explored what the pt feels that they need and want from their support system and empowered the pt to commit themselves to develop a support sytem and to not give up on developing and utilizing their healthy support system. Explored with the pt how it makes them feel to be supportive and looked at how they can look for new supports and resources to help them with providing positive support.    Encouraged the pt to participate in activities in which they will experience success and achievement. Explored with the pt distraction as a coping mechanism and explored cognitive distractions, behavioral distraction and physiological distractions. Provided pt with examples of each form of distractions and explored with pt new ways that they could implement the distractions into their daily routine when they face difficult emotions and stressors.   Explored with the pt in session her strengths and explored characteristics that are strengths and how her strengths  make her who she is.     Plan: Discussed with the pt the transition of a new therapist and provided the pt with the choice of continuing services with the new therapist and answered any questions that the pt had about the transition.  Pt agreed to the transition to the new therapist Katie Bounds, LCSW.   Diagnosis:  Encounter Diagnoses  Name Primary?   GAD (generalized anxiety disorder) Yes   MDD (major depressive disorder), recurrent, in partial remission ([place])      Collaboration of Care: Pt encouraged to continue care with psychiatrist of record Dr. Shea Evans.    Patient/Guardian was advised Release of Information must be obtained prior to any record release in order to collaborate their care with an outside provider. Patient/Guardian was advised if they have not already done so to contact the registration department to sign all necessary forms in order for [place] to release information regarding their care.   Consent: Patient/Guardian gives verbal consent for treatment and assignment of benefits for services provided during this visit. Patient/Guardian expressed understanding and agreed to proceed.   Lorenda Hatchet 12/04/2022

## 2022-12-07 DIAGNOSIS — M5134 Other intervertebral disc degeneration, thoracic region: Secondary | ICD-10-CM | POA: Diagnosis not present

## 2022-12-09 DIAGNOSIS — M5134 Other intervertebral disc degeneration, thoracic region: Secondary | ICD-10-CM | POA: Diagnosis not present

## 2022-12-11 ENCOUNTER — Ambulatory Visit (INDEPENDENT_AMBULATORY_CARE_PROVIDER_SITE_OTHER): Payer: Medicare Other | Admitting: Psychiatry

## 2022-12-11 ENCOUNTER — Encounter: Payer: Self-pay | Admitting: Psychiatry

## 2022-12-11 VITALS — BP 135/75 | HR 86 | Temp 97.9°F | Ht 63.0 in | Wt 177.0 lb

## 2022-12-11 DIAGNOSIS — F411 Generalized anxiety disorder: Secondary | ICD-10-CM | POA: Diagnosis not present

## 2022-12-11 DIAGNOSIS — F331 Major depressive disorder, recurrent, moderate: Secondary | ICD-10-CM | POA: Diagnosis not present

## 2022-12-11 MED ORDER — CITALOPRAM HYDROBROMIDE 20 MG PO TABS: 30.0000 mg | ORAL_TABLET | Freq: Every day | ORAL | 0 refills | Status: DC

## 2022-12-11 MED ORDER — BUPROPION HCL ER (XL) 300 MG PO TB24: 300.0000 mg | ORAL_TABLET | Freq: Every day | ORAL | 0 refills | Status: DC

## 2022-12-11 MED ORDER — TRAZODONE HCL 50 MG PO TABS: 50.0000 mg | ORAL_TABLET | Freq: Every day | ORAL | 0 refills | Status: DC

## 2022-12-11 NOTE — Progress Notes (Signed)
[place] MD OP Progress Note  12/11/2022 1:13 PM Elizabeth Hull  MRN:  [id number]  Chief Complaint:  Chief Complaint  Patient presents with   Follow-up   Anxiety   Depression   Medication Refill   HPI: Elizabeth Hull is a 72 year old Caucasian female, married, retired, has a history of GAD, MDD, lives in [place] was evaluated in office today.  Patient today tearful in session, reports she recently had to sell her car for a new 1 since she and her spouse were trying to make some changes due to financial reasons.  She reports she was able to get a [employer], however had to trade her [employer].  She however reports she loved her previous car so much that she had a day when she had a lot of crying spells and the next day she did not feel like doing anything at all.  She reports she is also upset about the fact that her therapist is leaving the practice and she currently does not know when her next therapy appointment will be with a new therapist coming in.  She is frustrated about that.  Patient reports although anxious she has been coping okay.  Patient denies any suicidality, homicidality or perceptual disturbances.  Currently compliant on the Wellbutrin which was added by her primary care provider for weight loss as well as the Celexa.  Agreeable to dosage increase of Wellbutrin since she continues to have episodes of depression as noted above.  Patient denies any other concerns today.  Visit Diagnosis:    ICD-10-CM   1. GAD (generalized anxiety disorder)  F41.1 citalopram (CELEXA) 20 MG tablet    traZODone (DESYREL) 50 MG tablet    2. MDD (major depressive disorder), recurrent episode, moderate (HCC)  F33.1 buPROPion (WELLBUTRIN XL) 300 MG 24 hr tablet      Past Psychiatric History: Reviewed past psychiatric history from progress note on 04/03/2022.  Past trials of Elavil, Xanax, BuSpar.  Past Medical History:  Past Medical History:  Diagnosis Date   Anxiety    Arthritis    Cervical spinal  stenosis    Complication of anesthesia    Depression    HNP (herniated nucleus pulposus), cervical    PONV (postoperative nausea and vomiting)    Thyroid condition     Past Surgical History:  Procedure Laterality Date   ANTERIOR CERVICAL DECOMP/DISCECTOMY FUSION N/A 09/16/2018   Procedure: C4-C5 ANTERIOR CERVICAL DECOMPRESSION/DISCECTOMY FUSION, ALLOGRAFT, PLATE;  Surgeon: Marybelle Killings, MD;  Location: [facility];  Service: Orthopedics;  Laterality: N/A;   BACK SURGERY  2017   lumbar   BRAIN TUMOR EXCISION     TUBAL LIGATION      Family Psychiatric History: Reviewed family psychiatric history from progress note on 04/03/2022  Family History:  Family History  Problem Relation Age of Onset   Depression Brother    Anxiety disorder Brother    Depression Son    Anxiety disorder Son    Bipolar disorder Granddaughter     Social History: Reviewed social history from progress note on 04/03/2022. Social History   Socioeconomic History   Marital status: Married    Spouse name: Not on file   Number of children: 2   Years of education: Not on file   Highest education level: High school graduate  Occupational History   Not on file  Tobacco Use   Smoking status: Never   Smokeless tobacco: Never  Vaping Use   Vaping Use: Never used  Substance and  Sexual Activity   Alcohol use: Yes    Comment: wine with dinner   Drug use: Never   Sexual activity: Yes  Other Topics Concern   Not on file  Social History Narrative   Not on file   Social Determinants of Health   Financial Resource Strain: Not on file  Food Insecurity: Not on file  Transportation Needs: Not on file  Physical Activity: Not on file  Stress: Not on file  Social Connections: Not on file    Allergies:  Allergies  Allergen Reactions   Hydrocodone Other (See Comments)    Unknown   Meperidine Hcl Other (See Comments)    Unknown   Nucynta [Tapentadol Hcl] Other (See Comments)    Unknown   Nucynta [Tapentadol]     Other    Sulfa Antibiotics Nausea Only   Codeine Nausea Only and Nausea And Vomiting    Metabolic Disorder Labs: No results found for: "HGBA1C", "MPG" No results found for: "PROLACTIN" No results found for: "CHOL", "TRIG", "HDL", "CHOLHDL", "VLDL", "LDLCALC" No results found for: "TSH"  Therapeutic Level Labs: No results found for: "LITHIUM" No results found for: "VALPROATE" No results found for: "CBMZ"  Current Medications: Current Outpatient Medications  Medication Sig Dispense Refill   Apple Cid Vn-Grn Tea-Bit Or-Cr (APPLE CIDER VINEGAR PLUS PO) Take by mouth.     B Complex-C (SUPER B COMPLEX/VITAMIN C PO) Take by mouth.     buPROPion (WELLBUTRIN XL) 300 MG 24 hr tablet Take 1 tablet (300 mg total) by mouth daily with breakfast. 90 tablet 0   busPIRone (BUSPAR) 15 MG tablet TAKE 1 TABLET BY MOUTH EVERY MORNING AND TAKE 2 TABLETS BY MOUTH EVERY EVENING 270 tablet 0   citalopram (CELEXA) 20 MG tablet Take 1.5 tablets (30 mg total) by mouth daily. 135 tablet 0   fluticasone (FLONASE) 50 MCG/ACT nasal spray Place 2 sprays into both nostrils daily as needed for allergies or rhinitis.     gabapentin (NEURONTIN) 600 MG tablet Take 600 mg by mouth 2 (two) times daily.     hydrOXYzine (ATARAX) 25 MG tablet Take 0.5-1 tablets (12.5-25 mg total) by mouth 3 (three) times daily as needed. For severe anxiety attacks only 270 tablet 0   KRILL OIL PO Take 1,200 mg by mouth daily.     levothyroxine (SYNTHROID) 88 MCG tablet Take 88 mcg by mouth daily.     Magnesium 250 MG TABS Take by mouth.     Multiple Vitamin (MULTI-VITAMIN DAILY PO) Take by mouth.     Multiple Vitamins-Minerals (VITAMIN D3 COMPLETE PO) Take by mouth.     RESTASIS 0.05 % ophthalmic emulsion 1 drop 2 (two) times daily.     rosuvastatin (CRESTOR) 5 MG tablet Take 5 mg by mouth at bedtime.  3   TURMERIC CURCUMIN PO Take by mouth.     valACYclovir (VALTREX) 1000 MG tablet Take 1,000 mg by mouth 3 (three) times daily.      traZODone (DESYREL) 50 MG tablet Take 1 tablet (50 mg total) by mouth at bedtime. 90 tablet 0   No current facility-administered medications for this visit.     Musculoskeletal: Strength & Muscle Tone: within normal limits Gait & Station: normal Patient leans: N/A  Psychiatric Specialty Exam: Review of Systems  Psychiatric/Behavioral:  Positive for decreased concentration and dysphoric mood.   All other systems reviewed and are negative.   Blood pressure 135/75, pulse 86, temperature 97.9 F (36.6 C), temperature source Oral, height 5' 3"$  (1.6  m), weight 177 lb (80.3 kg), SpO2 97 %.Body mass index is 31.35 kg/m.  General Appearance: Casual  Eye Contact:  Fair  Speech:  Normal Rate  Volume:  Normal  Mood:  Depressed  Affect:  Tearful  Thought Process:  Goal Directed and Descriptions of Associations: Intact  Orientation:  Full (Time, Place, and Person)  Thought Content: Logical   Suicidal Thoughts:  No  Homicidal Thoughts:  No  Memory:  Immediate;   Fair Recent;   Fair Remote;   Fair  Judgement:  Fair  Insight:  Fair  Psychomotor Activity:  Normal  Concentration:  Concentration: Fair and Attention Span: Fair  Recall:  [employer] of Knowledge: Fair  Language: Fair  Akathisia:  No  Handed:  Right  AIMS (if indicated): not done  Assets:  Communication Skills Desire for Improvement Housing Intimacy Social Support  ADL's:  Intact  Cognition: WNL  Sleep:  Fair   Screenings: [place] Visit from 04/03/2022 in [place] Total Score 0      GAD-7    [place] Office Visit from 12/11/2022 in [place] Counselor from 12/04/2022 in [place] [facility] from 11/19/2022 in [place] [facility] from 10/14/2022 in [place] Office Visit from  06/24/2022 in [place]  Total GAD-7 Score 0 0 1 9 10      $ PHQ2-9    [place] Office Visit from 12/11/2022 in [place] Counselor from 12/04/2022 in [place] Counselor from 11/19/2022 in [place] Counselor from 10/14/2022 in [place] Office Visit from 06/24/2022 in [place]  PHQ-2 Total Score 2 0 1 2 4  $ PHQ-9 Total Score 5 2 3 6 14      $ [place] Office Visit from 12/11/2022 in [place] Counselor from 12/04/2022 in [place] [facility] from 11/19/2022 in [place] No Risk No Risk No Risk        Assessment and Plan: Allizzon Coyle is a 72 year old Caucasian female who has a history of depression, anxiety, hypothyroidism, degenerative disc disease, history of brain tumor status post surgery, presented for medication management.  Patient with continued depressive symptoms, situational stressors, will benefit from medication management as well as psychotherapy sessions.  Plan as noted below.  Plan GAD-improving Will reduce Celexa to 30 mg p.o. daily Continue BuSpar 15 mg p.o. daily in the morning and 30 mg p.o. daily in the evening Hydroxyzine 12.5-25 mg p.o. 3 times daily as needed for anxiety attacks Patient to establish care with a new therapist, was recently following up with Ms. Verl Blalock  MDD-unstable Increase Wellbutrin XL to 300 mg p.o. daily Reduce Celexa to 30 mg p.o. daily Trazodone 50 mg p.o. nightly Continue CBT I have communicated with staff regarding therapy options for this patient.  Patient offered community resources however declines.  She would like to follow up with  a therapist at this practice.   Follow-up in clinic in 4 weeks or sooner if needed.  This note was generated in part or whole with voice recognition software. Voice recognition is usually quite accurate but there are transcription errors that can and  very often do occur. I apologize for any typographical errors that were not detected and corrected.     Ursula Alert, MD 12/11/2022, 1:13 PM

## 2022-12-14 ENCOUNTER — Ambulatory Visit (INDEPENDENT_AMBULATORY_CARE_PROVIDER_SITE_OTHER): Payer: Medicare Other | Admitting: Licensed Clinical Social Worker

## 2022-12-14 DIAGNOSIS — F411 Generalized anxiety disorder: Secondary | ICD-10-CM

## 2022-12-14 DIAGNOSIS — F3341 Major depressive disorder, recurrent, in partial remission: Secondary | ICD-10-CM
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
## 2023-01-04 ENCOUNTER — Ambulatory Visit (INDEPENDENT_AMBULATORY_CARE_PROVIDER_SITE_OTHER): Payer: Medicare Other | Admitting: Licensed Clinical Social Worker

## 2023-01-04 DIAGNOSIS — F3341 Major depressive disorder, recurrent, in partial remission: Secondary | ICD-10-CM

## 2023-01-04 DIAGNOSIS — F411 Generalized anxiety disorder: Secondary | ICD-10-CM

## 2023-01-04 NOTE — Progress Notes (Signed)
Virtual Visit via Video Note  I connected with Azja Maxim on 01/04/23 at  3:00 PM EST by a video enabled telemedicine application and verified that I am speaking with the correct person using two identifiers.  Location: Patient: home Provider: remote office [place], [place])   I discussed the limitations of evaluation and management by telemedicine and the availability of in person appointments. The patient expressed understanding and agreed to proceed.   I discussed the assessment and treatment plan with the patient. The patient was provided an opportunity to ask questions and all were answered. The patient agreed with the plan and demonstrated an understanding of the instructions.   The patient was advised to call back or seek an in-person evaluation if the symptoms worsen or if the condition fails to improve as anticipated.  I provided 60 minutes of non-face-to-face time during this encounter.   Raghad Lorenz R Shawonda Kerce, LCSW   THERAPIST PROGRESS NOTE  Session Time: 3-4p  Participation Level: Active  Behavioral Response: Neat and Well GroomedAlertAnxious  Type of Therapy: Individual Therapy  Treatment Goals addressed: Decrease depressive symptoms and improve levels of effective functioning-pt reports a decrease in overall depression symptoms 3 out of 5 sessions documented   Develop healthy thinking patterns and beliefs about self, others, and the world that lead to the alleviation and help prevent the relapse of depression per self report 3 out of 5 sessions documented    ProgressTowards Goals: Progressing  Interventions: CBT, Solution Focused, and Supportive  Summary: Sanii Gagen is a 72 y.o. female who presents with continuing symptoms related to generalized anxiety disorder.  Patient reports that she is compliant with her medication and with her psychiatric appointments.  Allowed pt to explore and express thoughts and feelings associated with recent life situations and  external stressors.  Patient reports that recently she has been having " bad days".  Patient reports that she uses one of her coping skills, which is journaling.  Patient states that some days she feels like she hates herself, she feels useless, and that she has no willpower.  When asked to explain willpower, patient reports that she recognizes that she binge eats at times.  Explored eating behaviors, triggers to eating behaviors, and subsequent feelings of guilt and remorse after a binge eating episode.  Encouraged patient to be aware of the triggers prior to binge eating, and to employee strategies discussed to modify eating behaviors.  Discussed recent feelings of depression.  Patient states that she is not aware of any external triggers for the depression symptoms.  Discussed extended family members--allow patient to explore her relationship with her stepdaughter, and father-in-law.  Patient states her stepdaughter lives in [place], and has 2 children that they rarely see.  Patient states that her father-in-law is a narcissist and hard to get along with.  Allowed patient to explore elements in her life where she has expectations, and allowed patient to identify current realities, and discussed the differences between expectations and reality and how that can trigger emotions like frustration, anger, and disappointment.  Patient reports that she is intentional about engaging in self-care behaviors such as watching TV, and engaging in church groups, community groups, chairing of clothes closet, and working outside and feeding birds.  Reviewed coping skills for managing depression, managing panic attacks, emotion regulation, and managing anxiety.  Continued recommendations are as follows: self care behaviors, positive social engagements, focusing on overall work/home/life balance, and focusing on positive physical and emotional wellness.   Suicidal/Homicidal: No  Therapist  Response: Pt is continuing to  apply interventions learned in session into daily life situations. Pt is currently on track to meet goals utilizing interventions mentioned above. Personal growth and progress noted. Treatment to continue as indicated.   Patient is currently making progress this with overall self understanding, improving ability to identify environmental triggers, and improving in the area of family and relational functioning.  Plan: Return again in 4 weeks.  Diagnosis:  Encounter Diagnoses  Name Primary?   GAD (generalized anxiety disorder) Yes   MDD (major depressive disorder), recurrent, in partial remission ([place])     Collaboration of Care: Other pt to continue care with psychiatrist of record, Dr. Ursula Alert.  Patient/Guardian was advised Release of Information must be obtained prior to any record release in order to collaborate their care with an outside provider. Patient/Guardian was advised if they have not already done so to contact the registration department to sign all necessary forms in order for [place] to release information regarding their care.   Consent: Patient/Guardian gives verbal consent for treatment and assignment of benefits for services provided during this visit. Patient/Guardian expressed understanding and agreed to proceed.   [place], LCSW 01/04/2023

## 2023-01-05 ENCOUNTER — Encounter (HOSPITAL_COMMUNITY): Payer: Self-pay

## 2023-01-21 ENCOUNTER — Ambulatory Visit (INDEPENDENT_AMBULATORY_CARE_PROVIDER_SITE_OTHER): Payer: Medicare Other | Admitting: Psychiatry

## 2023-01-21 ENCOUNTER — Encounter: Payer: Self-pay | Admitting: Psychiatry

## 2023-01-21 VITALS — BP 128/65 | HR 86 | Temp 98.4°F | Ht 63.0 in | Wt 176.6 lb

## 2023-01-21 DIAGNOSIS — F3342 Major depressive disorder, recurrent, in full remission: Secondary | ICD-10-CM

## 2023-01-21 DIAGNOSIS — F411 Generalized anxiety disorder: Secondary | ICD-10-CM

## 2023-01-21 NOTE — Progress Notes (Signed)
[facility] MD OP Progress Note  01/21/2023 5:04 PM Elizabeth Hull  MRN:  [id number]  Chief Complaint:  Chief Complaint  Patient presents with   Follow-up   Anxiety   Depression   Medication Refill   HPI: Elizabeth Hull is a 72 year old Caucasian female, married, retired, has a history of GAD, MDD, lives in [place] was evaluated in office today.  Patient today reports since being on the higher dosage of Wellbutrin and the current combination of medication as well as starting psychotherapy with Ms. Christina Hussami, she has been feeling better.  She reports last few weeks she had only 1 day when she felt depressed and did not feel like doing much.  She just sat on her couch that day and did not do much.  Otherwise she has been doing fairly well.  She reports she is tolerating the medications well.  Denies side effects.  Patient reports sleep is overall good.  Patient reports appetite as fair.  She has been watching her diet.  She is unhappy about the fact that she did not meet criteria for weight loss medication Wegovy.  She reports her health insurance does not pay for it.  Her husband was able to get the medication approved.  She has been watching her diet along with her husband.  She is trying to lose weight.  Patient denies any suicidality, homicidality or perceptual disturbances.  Patient denies any other concerns today.  Visit Diagnosis:    ICD-10-CM   1. GAD (generalized anxiety disorder)  F41.1     2. MDD (major depressive disorder), recurrent, in full remission ([place])  F33.42       Past Psychiatric History: I have reviewed past psychiatric history from progress note on 04/03/2022.  Past trials of Elavil, Xanax, BuSpar.  Past Medical History:  Past Medical History:  Diagnosis Date   Anxiety    Arthritis    Cervical spinal stenosis    Complication of anesthesia    Depression    HNP (herniated nucleus pulposus), cervical    PONV (postoperative nausea and vomiting)    Thyroid condition      Past Surgical History:  Procedure Laterality Date   ANTERIOR CERVICAL DECOMP/DISCECTOMY FUSION N/A 09/16/2018   Procedure: C4-C5 ANTERIOR CERVICAL DECOMPRESSION/DISCECTOMY FUSION, ALLOGRAFT, PLATE;  Surgeon: Marybelle Killings, MD;  Location: [facility];  Service: Orthopedics;  Laterality: N/A;   BACK SURGERY  2017   lumbar   BRAIN TUMOR EXCISION     TUBAL LIGATION      Family Psychiatric History: I have reviewed family psychiatric history from progress note on 04/03/2022.  Family History:  Family History  Problem Relation Age of Onset   Depression Brother    Anxiety disorder Brother    Depression Son    Anxiety disorder Son    Bipolar disorder Granddaughter     Social History: I have reviewed social history from progress note on 04/03/2022. Social History   Socioeconomic History   Marital status: Married    Spouse name: Not on file   Number of children: 2   Years of education: Not on file   Highest education level: High school graduate  Occupational History   Not on file  Tobacco Use   Smoking status: Never   Smokeless tobacco: Never  Vaping Use   Vaping Use: Never used  Substance and Sexual Activity   Alcohol use: Yes    Comment: wine with dinner   Drug use: Never   Sexual activity: Yes  Other  Topics Concern   Not on file  Social History Narrative   Not on file   Social Determinants of Health   Financial Resource Strain: Not on file  Food Insecurity: Not on file  Transportation Needs: Not on file  Physical Activity: Not on file  Stress: Not on file  Social Connections: Not on file    Allergies:  Allergies  Allergen Reactions   Hydrocodone Other (See Comments)    Unknown   Meperidine Hcl Other (See Comments)    Unknown   Nucynta [Tapentadol Hcl] Other (See Comments)    Unknown   Nucynta [Tapentadol]    Other    Sulfa Antibiotics Nausea Only   Codeine Nausea Only and Nausea And Vomiting    Metabolic Disorder Labs: No results found for: "HGBA1C",  "MPG" No results found for: "PROLACTIN" No results found for: "CHOL", "TRIG", "HDL", "CHOLHDL", "VLDL", "LDLCALC" No results found for: "TSH"  Therapeutic Level Labs: No results found for: "LITHIUM" No results found for: "VALPROATE" No results found for: "CBMZ"  Current Medications: Current Outpatient Medications  Medication Sig Dispense Refill   Apple Cid Vn-Grn Tea-Bit Or-Cr (APPLE CIDER VINEGAR PLUS PO) Take by mouth.     B Complex-C (SUPER B COMPLEX/VITAMIN C PO) Take by mouth.     buPROPion (WELLBUTRIN XL) 300 MG 24 hr tablet Take 1 tablet (300 mg total) by mouth daily with breakfast. 90 tablet 0   busPIRone (BUSPAR) 15 MG tablet TAKE 1 TABLET BY MOUTH EVERY MORNING AND TAKE 2 TABLETS BY MOUTH EVERY EVENING 270 tablet 0   citalopram (CELEXA) 20 MG tablet Take 1.5 tablets (30 mg total) by mouth daily. 135 tablet 0   fluticasone (FLONASE) 50 MCG/ACT nasal spray Place 2 sprays into both nostrils daily as needed for allergies or rhinitis.     gabapentin (NEURONTIN) 600 MG tablet Take 600 mg by mouth 2 (two) times daily.     hydrOXYzine (ATARAX) 25 MG tablet Take 0.5-1 tablets (12.5-25 mg total) by mouth 3 (three) times daily as needed. For severe anxiety attacks only 270 tablet 0   KRILL OIL PO Take 1,200 mg by mouth daily.     levothyroxine (SYNTHROID) 88 MCG tablet Take 88 mcg by mouth daily.     Magnesium 250 MG TABS Take by mouth.     Multiple Vitamin (MULTI-VITAMIN DAILY PO) Take by mouth.     Multiple Vitamins-Minerals (VITAMIN D3 COMPLETE PO) Take by mouth.     RESTASIS 0.05 % ophthalmic emulsion 1 drop 2 (two) times daily.     rosuvastatin (CRESTOR) 5 MG tablet Take 5 mg by mouth at bedtime.  3   traZODone (DESYREL) 50 MG tablet Take 1 tablet (50 mg total) by mouth at bedtime. 90 tablet 0   TURMERIC CURCUMIN PO Take by mouth.     valACYclovir (VALTREX) 1000 MG tablet Take 1,000 mg by mouth 3 (three) times daily.     No current facility-administered medications for this visit.      Musculoskeletal: Strength & Muscle Tone: within normal limits Gait & Station: normal Patient leans: N/A  Psychiatric Specialty Exam: Review of Systems  Psychiatric/Behavioral:  The patient is nervous/anxious.   All other systems reviewed and are negative.   Blood pressure 128/65, pulse 86, temperature 98.4 F (36.9 C), temperature source Skin, height 5\' 3"  (1.6 m), weight 176 lb 9.6 oz (80.1 kg).Body mass index is 31.28 kg/m.  General Appearance: Casual  Eye Contact:  Fair  Speech:  Clear and Coherent  Volume:  Normal  Mood:  Anxious  Affect:  Full Range  Thought Process:  Goal Directed and Descriptions of Associations: Intact  Orientation:  Full (Time, Place, and Person)  Thought Content: Logical   Suicidal Thoughts:  No  Homicidal Thoughts:  No  Memory:  Immediate;   Fair Recent;   Fair Remote;   Fair  Judgement:  Fair  Insight:  Fair  Psychomotor Activity:  Normal  Concentration:  Concentration: Fair and Attention Span: Fair  Recall:  [employer] of Knowledge: Fair  Language: Fair  Akathisia:  No  Handed:  Right  AIMS (if indicated): not done  Assets:  Communication Skills Desire for Improvement Housing Social Support  ADL's:  Intact  Cognition: WNL  Sleep:  Fair   Screenings: [place] Visit from 04/03/2022 in [facility] Total Score 0      [place] [facility] Office Visit from 01/21/2023 in [facility] Office Visit from 12/11/2022 in [facility] Counselor from 12/04/2022 in [facility] from 11/19/2022 in [facility] from 10/14/2022 in [facility]  Total GAD-7 Score 2 0 0 1 9      PHQ2-9    [place] Visit from 01/21/2023 in [facility] Office Visit from 12/11/2022 in [facility] Counsel[facility] from 12/04/2022 in [facility] from 11/19/2022 in [facility] from 10/14/2022 in [facility]  PHQ-2 Total Score 0 2 0 1 2  PHQ-9 Total Score 3 5 2 3 6       [facility] Office Visit from 01/21/2023 in [facility] Counsel[facility] from 01/04/2023 in [facility] from 12/11/2022 in [facility] No Risk No Risk No Risk        Assessment and Plan: Elizabeth Hull is a 72 year old Caucasian female who has a history of depression, anxiety, hypothyroidism, degenerative disc disease, has a history of brain tumor status post surgery was evaluated in office today.  Patient is currently improving, will benefit from the following plan.  Plan GAD-improving Celexa 30 mg p.o. daily BuSpar 15 mg p.o. daily in the morning and 30 mg p.o. daily in the evening Hydroxyzine 12.5-25 mg p.o. 3 times daily as needed for anxiety attacks Continue CBT with Ms. Christina Hussami'  MDD in remission Wellbutrin XL 300 mg p.o. daily Celexa reduced dosage of 30 mg p.o. daily Trazodone 50 mg p.o. nightly Continue CBT  Follow-up in clinic in 3 to 4 months or sooner if needed.  Collaboration of Care: Collaboration of Care: Other I have reviewed all per Ms. Christina Hussami, patient currently in CBT, I have also communicated with therapist.  Patient encouraged to stay in therapy.  Patient/Guardian was advised Release of Information must be obtained prior to any record release in order to collaborate their care with an outside provider. Patient/Guardian was advised if they have not already done so to contact the registration  department to sign all necessary forms in order for [place] to release information regarding their care.   Consent: Patient/Guardian gives verbal consent for treatment and assignment of benefits for services provided during this  visit. Patient/Guardian expressed understanding and agreed to proceed.   This note was generated in part or whole with voice recognition software. Voice recognition is usually quite accurate but there are transcription errors that can and very often do occur. I apologize for any typographical errors that were not detected and corrected.    Ursula Alert, MD 01/21/2023, 5:04 PM

## 2023-01-22 ENCOUNTER — Ambulatory Visit (INDEPENDENT_AMBULATORY_CARE_PROVIDER_SITE_OTHER): Payer: Medicare Other | Admitting: Licensed Clinical Social Worker

## 2023-01-22 DIAGNOSIS — F411 Generalized anxiety disorder: Secondary | ICD-10-CM | POA: Diagnosis not present

## 2023-01-22 DIAGNOSIS — F3341 Major depressive disorder, recurrent, in partial remission: Secondary | ICD-10-CM

## 2023-01-22 NOTE — Progress Notes (Signed)
Virtual Visit via Video Note  I connected with Tehra Ditsworth on 01/22/23 at  8:00 AM EDT by a video enabled telemedicine application and verified that I am speaking with the correct person using two identifiers.  Location: Patient: home Provider: remote office [place], [place])   I discussed the limitations of evaluation and management by telemedicine and the availability of in person appointments. The patient expressed understanding and agreed to proceed.   I discussed the assessment and treatment plan with the patient. The patient was provided an opportunity to ask questions and all were answered. The patient agreed with the plan and demonstrated an understanding of the instructions.   The patient was advised to call back or seek an in-person evaluation if the symptoms worsen or if the condition fails to improve as anticipated.  I provided 45 minutes of non-face-to-face time during this encounter.   [place], LCSW   THERAPIST PROGRESS NOTE  Session Time: [phone]  Participation Level: Active  Behavioral Response: Neat and Well GroomedAlertAnxious  Type of Therapy: Individual Therapy  Treatment Goals addressed: Decrease depressive symptoms and improve levels of effective functioning-pt reports a decrease in overall depression symptoms 3 out of 5 sessions documented   Develop healthy thinking patterns and beliefs about self, others, and the world that lead to the alleviation and help prevent the relapse of depression per self report 3 out of 5 sessions documented    ProgressTowards Goals: Progressing  Interventions: CBT, Solution Focused, and Supportive  Summary: Vicktoria Utesch is a 72 y.o. female who presents with continuing symptoms related to generalized anxiety disorder.  Patient reports that she is compliant with her medication and with her psychiatric appointments.  Allowed pt to explore and express thoughts and feelings associated with recent life situations and  external stressors.  --husband taking wegovy  --oprah talking about weight loss drugs. Discussing weight and obesity. Obesity is a disease.   --discussed weight/--age 9--love [employer].   --weight watchers groups on facebook  --identifies salty foods as binge food.  --mood: good, energy good--appt w/ Dr. Johnette Abraham  --praying to god for his help has truly started to help.  The medication is helping. She started 300 mg of buproprion. Maybe that finally kicked in.   --want to go back to the gym--not sure if I really want to go.  Zettie Pho will kedp busy.  --sleep: doing good. Getting 8 hours/8.5 hours.  Last night we watched the [place] game and they won. We went to bed 11:30pm. I could not go to sleep. Midnight I got up and went into the LR. Got in recliner and read on kindle. Read till 1:45am  --brian found out last week doing all this reorganizing at [employer]. 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 It was less money than what he was doing in the past. Doing away with his job. 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 He has to go back on shift work. 12 hour days. 4 days/night  --husband feels that [employer] is making bad decisions  --saw brians daughter and grandsons--baby turned one. They came and hung out at the hotel. Saw him take his first steps. He is the cutest. Daughter is very immature. Hit deer on minivan. Broke a window. She was supposed to no car to borrow. Fiance legt--wouldn't let her drive the truck.   --brian got upset--called Brian's father. Talking to him and then he asked about Tori. Aaron Edelman said she's doing okay. He did not know the baby's  name. Never saw the 6yo. Voiced how he feels. He threw his phone across the living room. He went to the bed and cried. His mom is better--she is doing better and will go with [place] to see them in [place].   --father wanted to talk to Aaron Edelman about "their relationship".  His mom is coming.     Continued recommendations are as  follows: self care behaviors, positive social engagements, focusing on overall work/home/life balance, and focusing on positive physical and emotional wellness.   Suicidal/Homicidal: No  Therapist Response: Pt is continuing to apply interventions learned in session into daily life situations. Pt is currently on track to meet goals utilizing interventions mentioned above. Personal growth and progress noted. Treatment to continue as indicated.   Patient is currently making progress this with overall self understanding, improving ability to identify environmental triggers, and improving in the area of family and relational functioning.  Plan: Return again in 4 weeks.  Diagnosis:  No diagnosis found.   Collaboration of Care: Other pt to continue care with psychiatrist of record, Dr. Ursula Alert.  Patient/Guardian was advised Release of Information must be obtained prior to any record release in order to collaborate their care with an outside provider. Patient/Guardian was advised if they have not already done so to contact the registration department to sign all necessary forms in order for [place] to release information regarding their care.   Consent: Patient/Guardian gives verbal consent for treatment and assignment of benefits for services provided during this visit. Patient/Guardian expressed understanding and agreed to proceed.   [place], LCSW 01/22/2023

## 2023-02-02 ENCOUNTER — Other Ambulatory Visit: Payer: Self-pay | Admitting: Psychiatry

## 2023-02-02 DIAGNOSIS — F3341 Major depressive disorder, recurrent, in partial remission: Secondary | ICD-10-CM

## 2023-02-02 DIAGNOSIS — F411 Generalized anxiety disorder: Secondary | ICD-10-CM

## 2023-02-04 ENCOUNTER — Ambulatory Visit: Payer: Medicare Other | Admitting: Psychiatry

## 2023-02-12 DIAGNOSIS — H43813 Vitreous degeneration, bilateral: Secondary | ICD-10-CM | POA: Diagnosis not present

## 2023-02-12 DIAGNOSIS — H524 Presbyopia: Secondary | ICD-10-CM | POA: Diagnosis not present

## 2023-02-23 ENCOUNTER — Other Ambulatory Visit: Payer: Self-pay | Admitting: Psychiatry

## 2023-02-23 DIAGNOSIS — F411 Generalized anxiety disorder: Secondary | ICD-10-CM

## 2023-02-23 DIAGNOSIS — F3341 Major depressive disorder, recurrent, in partial remission: Secondary | ICD-10-CM

## 2023-03-03 ENCOUNTER — Ambulatory Visit (INDEPENDENT_AMBULATORY_CARE_PROVIDER_SITE_OTHER): Payer: Medicare Other | Admitting: Licensed Clinical Social Worker

## 2023-03-03 DIAGNOSIS — F411 Generalized anxiety disorder: Secondary | ICD-10-CM

## 2023-03-03 NOTE — Progress Notes (Signed)
Virtual Visit via Video Note  I connected with Elizabeth Hull on 03/03/23 at 11:00 AM EDT by a video enabled telemedicine application and verified that I am speaking with the correct person using two identifiers.  Location: Patient: home Provider: remote office [place], [place])   I discussed the limitations of evaluation and management by telemedicine and the availability of in person appointments. The patient expressed understanding and agreed to proceed.   I discussed the assessment and treatment plan with the patient. The patient was provided an opportunity to ask questions and all were answered. The patient agreed with the plan and demonstrated an understanding of the instructions.   The patient was advised to call back or seek an in-person evaluation if the symptoms worsen or if the condition fails to improve as anticipated.  I provided 30 minutes of non-face-to-face time during this encounter.   Miquela Costabile R Jemia Fata, LCSW   THERAPIST PROGRESS NOTE  Session Time: 11-1130a  Participation Level: Active  Behavioral Response: Neat and Well GroomedAlertAnxious  Type of Therapy: Individual Therapy  Treatment Goals addressed: Decrease depressive symptoms and improve levels of effective functioning-pt reports a decrease in overall depression symptoms 3 out of 5 sessions documented   Develop healthy thinking patterns and beliefs about self, others, and the world that lead to the alleviation and help prevent the relapse of depression per self report 3 out of 5 sessions documented    ProgressTowards Goals: Progressing  Interventions: CBT, Solution Focused, and Supportive  Summary: Elizabeth Hull is a 72 y.o. female who presents with continuing symptoms related to generalized anxiety disorder.   Clinician assisted pt with identifying situations/scenarios/schemas triggering anxiety and/or depression symptoms. Allowed pt to explore and express thoughts and feelings and discussed current  coping mechanisms. Reviewed changes/recommendations. Pt reports that she is enjoying spending time outside working in her gardens. Assisted pt in identifying external stressors--pt worried about husband and his overall health due to extra stress he's experiencing due to work-related changes. Pt identifies her church community as her primary social support and enjoys events there on a regular basis.   Continued recommendations are as follows: self care behaviors, positive social engagements, focusing on overall work/home/life balance, and focusing on positive physical and emotional wellness.   Suicidal/Homicidal: No  Therapist Response: Pt is continuing to apply interventions learned in session into daily life situations. Pt is currently on track to meet goals utilizing interventions mentioned above. Personal growth and progress noted. Treatment to continue as indicated.   Patient is currently making progress this with overall self understanding, improving ability to identify environmental triggers, and improving in the area of family and relational functioning.  Plan: Return again in 4 weeks.  Diagnosis:  Encounter Diagnosis  Name Primary?   GAD (generalized anxiety disorder) Yes    Collaboration of Care: Other pt to continue care with psychiatrist of record, Dr. Jomarie Longs.  Patient/Guardian was advised Release of Information must be obtained prior to any record release in order to collaborate their care with an outside provider. Patient/Guardian was advised if they have not already done so to contact the registration department to sign all necessary forms in order for [place] to release information regarding their care.   Consent: Patient/Guardian gives verbal consent for treatment and assignment of benefits for services provided during this visit. Patient/Guardian expressed understanding and agreed to proceed.   Ernest Haber Naia Ruff, LCSW 03/03/2023

## 2023-03-09 ENCOUNTER — Other Ambulatory Visit: Payer: Self-pay | Admitting: Psychiatry

## 2023-03-09 DIAGNOSIS — F331 Major depressive disorder, recurrent, moderate: Secondary | ICD-10-CM

## 2023-03-18 ENCOUNTER — Other Ambulatory Visit: Payer: Self-pay | Admitting: Psychiatry

## 2023-03-18 DIAGNOSIS — F411 Generalized anxiety disorder: Secondary | ICD-10-CM

## 2023-04-29 ENCOUNTER — Other Ambulatory Visit: Payer: Self-pay | Admitting: Psychiatry

## 2023-04-29 DIAGNOSIS — F411 Generalized anxiety disorder: Secondary | ICD-10-CM

## 2023-05-04 ENCOUNTER — Ambulatory Visit (INDEPENDENT_AMBULATORY_CARE_PROVIDER_SITE_OTHER): Payer: Medicare Other | Admitting: Licensed Clinical Social Worker

## 2023-05-04 DIAGNOSIS — F411 Generalized anxiety disorder: Secondary | ICD-10-CM

## 2023-05-04 NOTE — Patient Instructions (Signed)
Outpatient Psychiatry and Counseling  FOR CRISIS:  call 911, [facility]: Mobile Crisis Management 24 hours:  [phone], call [phone], [facility] ([facility]) [address] walk in, or go to your local EMERGENCY DEPARTMENT  [facility] [place] sliding scale fee and walk in schedule: M-F 8am-12pm/1pm-3pm [address]  [place], [place] [address] [phone]  [facility] [address] [place], [place] [address] [phone]  [facility] Outpatient Services/ Intensive Outpatient Therapy Program/CDIOP/PHP [address] [place], [place] [address] [phone]  [facility], Outpatient Therapy Services, [place]      [phone]     [address]    [place], [place] [address]                 [facility]   [facility] [phone]. [address] [place], [place] [address]  [facility]          [address]  [place], [place] [address]       [phone]  [facility] [address] [place], [place] [address] [phone]  [facility]    [address]    [place], [place] [address]     [phone]       [facility] [address] [place] [address]  [facility]     [address] [place], [place]      [phone]       [facility] Eulogio Ditch, MD [address] [place], [place] [address] [phone]  [facility]     [address]     [place], [place] [address]     [phone]       [facility] [address] [place], [place] [address] [phone] Resources for Temporary Residential Assistance/Crisis Centers

## 2023-05-04 NOTE — Progress Notes (Signed)
Virtual Visit via Video Note  I connected with Elizabeth Hull on 05/04/23 at 11:00 AM EDT by a video enabled telemedicine application and verified that I am speaking with the correct person using two identifiers.  Location: Patient: home Provider: remote office [place], [place])   I discussed the limitations of evaluation and management by telemedicine and the availability of in person appointments. The patient expressed understanding and agreed to proceed.   I discussed the assessment and treatment plan with the patient. The patient was provided an opportunity to ask questions and all were answered. The patient agreed with the plan and demonstrated an understanding of the instructions.   The patient was advised to call back or seek an in-person evaluation if the symptoms worsen or if the condition fails to improve as anticipated.  I provided 30 minutes of non-face-to-face time during this encounter.   Brentt Fread R Roe Koffman, LCSW   THERAPIST PROGRESS NOTE  Session Time: 11-1130a  Participation Level: Active  Behavioral Response: Neat and Well GroomedAlertAnxious  Type of Therapy: Individual Therapy  Treatment Goals addressed: Decrease depressive symptoms and improve levels of effective functioning-pt reports a decrease in overall depression symptoms 3 out of 5 sessions documented   Develop healthy thinking patterns and beliefs about self, others, and the world that lead to the alleviation and help prevent the relapse of depression per self report 3 out of 5 sessions documented    ProgressTowards Goals: Progressing  Interventions: CBT, Solution Focused, and Supportive  Summary: Gurpreet Granados is a 72 y.o. female who presents with imiproving symptoms related to generalized anxiety disorder.   Clinician assisted pt with identifying situations/scenarios/schemas triggering anxiety and/or depression symptoms. Allowed pt to explore and express thoughts and feelings and discussed current  coping mechanisms. Reviewed changes/recommendations.   Pt excited about recent road trip to [place] to celebrate pt and husband's 20th anniversary. Allowed pt to share positive memories associated with the trip and how it was a bonding experience for her and her husband.  Pt denies any significant episodes of depression or any anger episodes. Pt feels that they are managing situational stressors well. Pt reports that sleep quality and quantity is good and pt reporting normal appetite. Pt feels that they are doing a good job of using coping skills in the moment. Pt does not have any other questions or concerns to discuss at today's session.   Pt was happy to report that she had lost 20 lbs prior to her vacation through [employer] and plans on continuing with the program.   Continued recommendations are as follows: self care behaviors, positive social engagements, focusing on overall work/home/life balance, and focusing on positive physical and emotional wellness.   Suicidal/Homicidal: No  Therapist Response: Pt is continuing to apply interventions learned in session into daily life situations. Pt is currently on track to meet goals utilizing interventions mentioned above. Personal growth and progress noted. Treatment to continue as indicated.   Patient is currently making progress this with overall self understanding, improving ability to identify environmental triggers, and improving in the area of family and relational functioning.  Plan: Informed patient that clinician will be leaving outpatient department. Allowed pt to explore any questions or concerns and discussed future counseling options/resources. Provided pt with psychoeducational resources and list of OPT therapists. Encouraged pt to continue with psychiatric med management appointments, if applicable.   Diagnosis:  Encounter Diagnosis  Name Primary?   GAD (generalized anxiety disorder) Yes     Collaboration of Care: Other pt  to continue care with psychiatrist of record, Dr. Jomarie Longs.  Patient/Guardian was advised Release of Information must be obtained prior to any record release in order to collaborate their care with an outside provider. Patient/Guardian was advised if they have not already done so to contact the registration department to sign all necessary forms in order for [place] to release information regarding their care.   Consent: Patient/Guardian gives verbal consent for treatment and assignment of benefits for services provided during this visit. Patient/Guardian expressed understanding and agreed to proceed.   Ernest Haber Goro Wenrick, LCSW 05/04/2023

## 2023-05-25 ENCOUNTER — Ambulatory Visit (INDEPENDENT_AMBULATORY_CARE_PROVIDER_SITE_OTHER): Payer: Medicare Other | Admitting: Psychiatry

## 2023-05-25 ENCOUNTER — Encounter: Payer: Self-pay | Admitting: Psychiatry

## 2023-05-25 VITALS — BP 125/73 | HR 76 | Temp 97.1°F | Ht 63.0 in | Wt 152.6 lb

## 2023-05-25 DIAGNOSIS — F411 Generalized anxiety disorder: Secondary | ICD-10-CM

## 2023-05-25 DIAGNOSIS — F3342 Major depressive disorder, recurrent, in full remission: Secondary | ICD-10-CM | POA: Diagnosis not present

## 2023-05-25 MED ORDER — BUSPIRONE HCL 15 MG PO TABS: 45.0000 mg | ORAL_TABLET | ORAL | 1 refills | Status: DC

## 2023-05-25 MED ORDER — BUPROPION HCL ER (XL) 300 MG PO TB24: 300.0000 mg | ORAL_TABLET | Freq: Every day | ORAL | 1 refills | Status: DC

## 2023-05-25 MED ORDER — CITALOPRAM HYDROBROMIDE 20 MG PO TABS: 30.0000 mg | ORAL_TABLET | Freq: Every day | ORAL | 1 refills | Status: DC

## 2023-05-25 NOTE — Progress Notes (Unsigned)
BH MD OP Progress Note  05/25/2023 1:51 PM Elizabeth Hull  MRN:  [id number]  Chief Complaint:  Chief Complaint  Patient presents with   Follow-up   Anxiety   Depression   Medication Refill   HPI: Elizabeth Hull is a 72 year old Caucasian female , married, retired, has a history of GAD, MDD, lives in either was evaluated in the office today.  Patient today reports she currently has multiple situational stressors.  She reports her husband is planning to move to a new job and [place].  If he does get that job [place].  Patient reports thinking about it has been anxiety provoking since she is worried about the move and getting adjusted to a new place again.  How she has been handling it well.  Patient reports it is her son's anniversary coming up on Monday, July 29.  She does continues to have some intrusive memories on and off.  Patient became tearful when she discussed that.  She however reports she has been coping with her grief okay.  Patient reports she is excited about the fact that she has lost around 20 pounds in the past few months.  She is currently part of [employer] program and that has tremendously helped.  She is currently watching her diet.  Patient is currently compliant on her medications like Celexa, BuSpar, Wellbutrin.  Denies side effects.  Patient reports sleep is overall good.  Reports trazodone helps her to fall asleep and stay asleep.  Denies side effects.  Patient denies any suicidality, homicidality or perceptual disturbances.  Patient also worries about the fact that her therapist is leaving the practice and now she has to find a new therapist.  She is however hopeful that she will be able to find someone new.  Does have resources available.  Patient denies any other concerns today.  Visit Diagnosis:    ICD-10-CM   1. GAD (generalized  anxiety disorder)  F41.1 citalopram (CELEXA) 20 MG tablet    busPIRone (BUSPAR) 15 MG tablet    2. MDD (major depressive disorder), recurrent, in full remission (HCC)  F33.42 buPROPion (WELLBUTRIN XL) 300 MG 24 hr tablet      Past Psychiatric History: I have reviewed past psychiatric history from progress note on 04/03/2022.  Past trials of Elavil, Xanax, BuSpar.  Past Medical History:  Past Medical History:  Diagnosis Date   Anxiety    Arthritis    Cervical spinal stenosis    Complication of anesthesia    Depression    HNP (herniated nucleus pulposus), cervical    PONV (postoperative nausea and vomiting)    Thyroid condition     Past Surgical History:  Procedure Laterality Date   ANTERIOR CERVICAL DECOMP/DISCECTOMY FUSION N/A 09/16/2018   Procedure: C4-C5 ANTERIOR CERVICAL DECOMPRESSION/DISCECTOMY FUSION, ALLOGRAFT, PLATE;  Surgeon: Eldred Manges, MD;  Location: [facility] OR;  Service: Orthopedics;  Laterality: N/A;   BACK SURGERY  2017   lumbar   BRAIN TUMOR EXCISION     TUBAL LIGATION      Family Psychiatric History: Reviewed family psychiatric issue from progress note on 04/03/2022.  Family History:  Family History  Problem Relation Age of Onset   Depression Brother    Anxiety disorder Brother    Depression Son    Anxiety disorder Son    Bipolar disorder Granddaughter     Social  History: Reviewed social history from progress note on 04/03/2022. Social History   Socioeconomic History   Marital status: Married    Spouse name: Not on file   Number of children: 2   Years of education: Not on file   Highest education level: High school graduate  Occupational History   Not on file  Tobacco Use   Smoking status: Never   Smokeless tobacco: Never  Vaping Use   Vaping status: Never Used  Substance and Sexual Activity   Alcohol use: Yes    Comment: wine with dinner   Drug use: Never   Sexual activity: Yes  Other Topics Concern   Not on file  Social History Narrative    Not on file   Social Determinants of Health   Financial Resource Strain: Not on file  Food Insecurity: Not on file  Transportation Needs: Not on file  Physical Activity: Not on file  Stress: Not on file  Social Connections: Not on file    Allergies:  Allergies  Allergen Reactions   Hydrocodone Other (See Comments)    Unknown   Meperidine Hcl Other (See Comments)    Unknown   Nucynta [Tapentadol Hcl] Other (See Comments)    Unknown   Nucynta [Tapentadol]    Other    Sulfa Antibiotics Nausea Only   Codeine Nausea Only and Nausea And Vomiting    Metabolic Disorder Labs: No results found for: "HGBA1C", "MPG" No results found for: "PROLACTIN" No results found for: "CHOL", "TRIG", "HDL", "CHOLHDL", "VLDL", "LDLCALC" No results found for: "TSH"  Therapeutic Level Labs: No results found for: "LITHIUM" No results found for: "VALPROATE" No results found for: "CBMZ"  Current Medications: Current Outpatient Medications  Medication Sig Dispense Refill   fluticasone (FLONASE) 50 MCG/ACT nasal spray Place 2 sprays into both nostrils daily as needed for allergies or rhinitis.     gabapentin (NEURONTIN) 600 MG tablet Take 600 mg by mouth 2 (two) times daily.     KRILL OIL PO Take 1,200 mg by mouth daily.     levothyroxine (SYNTHROID) 88 MCG tablet Take 88 mcg by mouth daily.     Multiple Vitamin (MULTI-VITAMIN DAILY PO) Take by mouth.     RESTASIS 0.05 % ophthalmic emulsion 1 drop 2 (two) times daily.     rosuvastatin (CRESTOR) 5 MG tablet Take 5 mg by mouth at bedtime.  3   traZODone (DESYREL) 50 MG tablet TAKE 1 TABLET(50 MG) BY MOUTH AT BEDTIME 90 tablet 0   valACYclovir (VALTREX) 1000 MG tablet Take 1,000 mg by mouth 3 (three) times daily.     buPROPion (WELLBUTRIN XL) 300 MG 24 hr tablet Take 1 tablet (300 mg total) by mouth daily. 90 tablet 1   busPIRone (BUSPAR) 15 MG tablet Take 3 tablets (45 mg total) by mouth as directed. Take 2 tablets daily morning and 1 tablet daily  evening. 270 tablet 1   citalopram (CELEXA) 20 MG tablet Take 1.5 tablets (30 mg total) by mouth daily. 135 tablet 1   No current facility-administered medications for this visit.     Musculoskeletal: Strength & Muscle Tone: within normal limits Gait & Station: normal Patient leans: N/A  Psychiatric Specialty Exam: Review of Systems  Psychiatric/Behavioral: Negative.      Blood pressure 125/73, pulse 76, temperature (!) 97.1 F (36.2 C), temperature source Skin, height 5\' 3"  (1.6 m), weight 152 lb 9.6 oz (69.2 kg).Body mass index is 27.03 kg/m.  General Appearance: Fairly Groomed  Eye Contact:  Fair  Speech:  Clear and Coherent  Volume:  Normal  Mood:  Euthymic  Affect:  Full Range  Thought Process:  Goal Directed and Descriptions of Associations: Intact  Orientation:  Full (Time, Place, and Person)  Thought Content: Logical   Suicidal Thoughts:  No  Homicidal Thoughts:  No  Memory:  Immediate;   Fair Recent;   Fair Remote;   Fair  Judgement:  Fair  Insight:  Fair  Psychomotor Activity:  Normal  Concentration:  Concentration: Fair and Attention Span: Fair  Recall:  [employer] of Knowledge: Fair  Language: Fair  Akathisia:  No  Handed:  Right  AIMS (if indicated): not done  Assets:  Manufacturing systems engineer Desire for Improvement Housing Social Support Transportation  ADL's:  Intact  Cognition: WNL  Sleep:  Fair   Screenings: Geneticist, molecular Office Visit from [facility]03/2022 in [facility]  AIMS Total Score 0      GAD-7    Flowsheet Row Office Visit from 05/25/2023 in [facility] Office Visit from 01/21/2023 in [facility] Office Visit from 12/11/2022 in [facility] Counselor from 12/04/2022 in [facility] Counselor from 11/19/2022 in [facility]  Total GAD-7 Score 0 2 0 0 1      PHQ2-9    Flowsheet Row Office Visit from 05/25/2023 in [facility] Office Visit from 01/21/2023 in [facility] Office Visit from 12/11/2022 in [facility] Counselor from 12/04/2022 in [facility] Counselor from 11/19/2022 in [facility]  PHQ-2 Total Score 0 0 2 0 1  PHQ-9 Total Score 0 3 5 2 3       Flowsheet Row Office Visit from 05/25/2023 in [facility] Office Visit from 01/21/2023 in [facility] Counselor from 01/04/2023 in [facility] at [facility] RISK CATEGORY No Risk No Risk No Risk        Assessment and Plan: Elizabeth Hull is a 72 year old Caucasian female who has a history of depression, anxiety, hypothyroidism, degenerative disc disease, history of brain tumor status post surgery was evaluated in the office today.  Patient is currently stable.  Plan as noted below.  Plan GAD-stable Celexa 30 mg p.o. daily BuSpar 30 mg p.o. daily in the morning and 15 mg p.o. daily in the evening. Hydroxyzine 12.5-25 mg p.o. 3 times daily as needed for anxiety attacks Continue CBT, patient encouraged to establish care with a new therapist-provider resources.   MDD in remission Wellbutrin XL 300 mg p.o. daily Celexa 30 mg p.o. daily Trazodone 50 mg p.o. nightly Continue CBT  Follow-up in clinic in 3 to 4 months or sooner if needed.  Collaboration of Care: Collaboration of Care: Patient encouraged to establish care with new therapist.  Provide resources.  Patient/Guardian was advised Release of Information must be obtained prior to any record release in order to collaborate their care with an outside provider.  Patient/Guardian was advised if they have not already done so to contact the registration department to sign all necessary forms in order for [place] to release information regarding their care.   Consent: Patient/Guardian gives verbal consent for treatment and assignment of benefits for services provided during this visit. Patient/Guardian  expressed understanding and agreed to proceed.   This note was generated in part or whole with voice recognition software. Voice recognition is usually quite accurate but there are transcription errors that can and very often do occur. I apologize for any typographical errors that were not detected and corrected.    Jomarie Longs, MD 05/26/2023, 9:20 AM

## 2023-05-25 NOTE — Patient Instructions (Signed)
Outpatient Psychiatry and Counseling  FOR CRISIS:  call 911, [facility]: Mobile Crisis Management 24 hours:  [phone], call [phone], [facility] ([facility]) [address] walk in, or go to your local EMERGENCY DEPARTMENT  [facility] sliding scale fee and walk in schedule: M-F 8am-12pm/1pm-3pm [address]  [place], [place] [address] [phone]  [facility] [address] [place], [place] [address] [phone]  [facility] Outpatient Services/ Intensive Outpatient Therapy Program/CDIOP/PHP [address] [place], [place] [address] [phone]  [facility]                  Crisis Services, Outpatient Therapy Services, Walk in Services      [phone]     [address]    [place], [place] [address]                 [facility]   [facility] [phone] [address] [place], [place] [address]  [facility]          [address],  [place], [place] [address]       [phone]  [facility] [address] [place], [place] [address] [phone]  [facility]    [address]    [place], [place] [address]     [phone]       [facility] [address] [place] [address]  [facility]     [address]     [place], [place]      [phone]       [facility] Shamsher Ahluwalia, MD [address] [place], [place] [address] [phone]  [facility]     [address]     [place], [place] [address]     [phone]       [facility] [address] [place], [place] [address] [phone] Resources for Temporary Residential Assistance/Crisis Centers  

## 2023-06-22 DIAGNOSIS — M5030 Other cervical disc degeneration, unspecified cervical region: Secondary | ICD-10-CM | POA: Diagnosis not present

## 2023-06-22 DIAGNOSIS — E611 Iron deficiency: Secondary | ICD-10-CM | POA: Diagnosis not present

## 2023-06-22 DIAGNOSIS — Z6826 Body mass index (BMI) 26.0-26.9, adult: Secondary | ICD-10-CM | POA: Diagnosis not present

## 2023-06-22 DIAGNOSIS — M542 Cervicalgia: Secondary | ICD-10-CM | POA: Diagnosis not present

## 2023-06-22 DIAGNOSIS — M810 Age-related osteoporosis without current pathological fracture: Secondary | ICD-10-CM | POA: Diagnosis not present

## 2023-06-22 DIAGNOSIS — Z299 Encounter for prophylactic measures, unspecified: Secondary | ICD-10-CM | POA: Diagnosis not present

## 2023-06-22 DIAGNOSIS — Z981 Arthrodesis status: Secondary | ICD-10-CM | POA: Diagnosis not present

## 2023-07-28 DIAGNOSIS — N3281 Overactive bladder: Secondary | ICD-10-CM | POA: Diagnosis not present

## 2023-07-28 DIAGNOSIS — Z299 Encounter for prophylactic measures, unspecified: Secondary | ICD-10-CM | POA: Diagnosis not present

## 2023-07-28 DIAGNOSIS — Z23 Encounter for immunization: Secondary | ICD-10-CM | POA: Diagnosis not present

## 2023-07-28 DIAGNOSIS — L659 Nonscarring hair loss, unspecified: Secondary | ICD-10-CM | POA: Diagnosis not present

## 2023-08-05 ENCOUNTER — Ambulatory Visit (INDEPENDENT_AMBULATORY_CARE_PROVIDER_SITE_OTHER): Payer: Medicare Other

## 2023-08-05 ENCOUNTER — Ambulatory Visit: Payer: Medicare Other | Admitting: Podiatry

## 2023-08-05 DIAGNOSIS — M7750 Other enthesopathy of unspecified foot: Secondary | ICD-10-CM

## 2023-08-05 DIAGNOSIS — Q6671 Congenital pes cavus, right foot: Secondary | ICD-10-CM | POA: Diagnosis not present

## 2023-08-05 DIAGNOSIS — M779 Enthesopathy, unspecified: Secondary | ICD-10-CM

## 2023-08-05 DIAGNOSIS — Q6672 Congenital pes cavus, left foot: Secondary | ICD-10-CM | POA: Diagnosis not present

## 2023-08-05 NOTE — Progress Notes (Signed)
Subjective:  Patient ID: Elizabeth Hull, female    DOB: 04-20-1951,  MRN: [id number]  Chief Complaint  Patient presents with   Foot Pain    Patient has a "knot" to the top of bilateral feet. Unable to wear any shoes that rub against the area. Right is worst than left.     72 y.o. female presents with concern for bumps on the top of both feet.  Patient does have very high arches.  She does not wear inserts for her shoes.  She is unaware of any shoes due to these bumps on the top of her foot she thinks may be bone spurs.  Right is worse than left.  Both cause her significant pain and she wants them both addressed.  She also has concern for possible toe deformity on the left third toe.  However this does not bother her enough that she thinks she needs it surgically corrected she is using a foam spacer in between the third and fourth toe which is helping with her pain on that side.  Past Medical History:  Diagnosis Date   Anxiety    Arthritis    Cervical spinal stenosis    Complication of anesthesia    Depression    HNP (herniated nucleus pulposus), cervical    PONV (postoperative nausea and vomiting)    Thyroid condition     Allergies  Allergen Reactions   Hydrocodone Other (See Comments)    Unknown   Nucynta [Tapentadol Hcl] Other (See Comments)    Unknown   Nucynta [Tapentadol]    Other    Sulfa Antibiotics Nausea Only   Codeine Nausea Only and Nausea And Vomiting    ROS: Negative except as per HPI above  Objective:  General: AAO x3, NAD  Dermatological: With inspection and palpation of the right and left lower extremities there are no open sores, no preulcerative lesions, no rash or signs of infection present. Nails are of normal length thickness and coloration.   Vascular:  Dorsalis Pedis artery and Posterior Tibial artery pedal pulses are 2/4 bilateral.  Capillary fill time < 3 sec to all digits.   Neruologic: Grossly intact via light touch bilateral. Protective threshold  intact to all sites bilateral.   Musculoskeletal: Palpable osseous prominence noted of the dorsal midfoot dorsal to the first tarsometatarsal joint bilateral foot significantly worse in the right foot than left however both are painful with palpation.  Significant pes cavus deformity with plantarflexed metatarsals noted deformities primarily in the TMT J level.  Left third toe with mild lateral deviation of the third toe in the transverse plane however there is no sagittal plane hammering.  Gait: Unassisted, Nonantalgic.   No images are attached to the encounter.  Radiographs:  Date: 08/05/2023 XR both feet Weightbearing AP/Lateral/Oblique   Findings: Pes cavus deformity is noted with plantarflexion of the metatarsals bilaterally at the first tarsometatarsal joint.  Increased calcaneal inclination angle.  On the right foot there is any significant hypertrophic osseous formation at the dorsal aspect of the first TMT J.  Left foot also with some prominence dorsally at the first MPJ. Assessment:   1. Bone spur of foot   2. Pes cavus of both feet   3. Capsulitis      Plan:  Patient was evaluated and treated and all questions answered.  # Bone spur of bilateral midfoot dorsally at the TMT J level right worse than left -Discussed with patient I do recommend surgical intervention given she has tried different  shoes offloading the area and anti-inflammatory therapy. -Surgically would recommend dorsal midfoot exostectomy bilateral foot.  Discussed risk benefits alternatives and possible complications associate with the procedure as well as the expected postop recovery course -Informed surgical consent was obtained we will begin surgical planning  # Pes cavus bilateral foot -Recommend custom-made orthotics due to severe pes cavus deformity bilaterally.  Believe these will allow her to walk with significantly less pain and prevent worsening pain or deformity in both feet  # Transverse plane  deformity third toe left foot -Agree with patient and that I recommend proceeding with conservative management of her transverse plane toe deformity did not recommend surgical intervention at this time as it is not very painful for her recommend use of gel toe cap or foam spacer.          Corinna Gab, DPM [facility] / [facility]

## 2023-08-09 ENCOUNTER — Other Ambulatory Visit: Payer: Self-pay | Admitting: Psychiatry

## 2023-08-09 DIAGNOSIS — F411 Generalized anxiety disorder: Secondary | ICD-10-CM

## 2023-08-17 ENCOUNTER — Telehealth: Payer: Self-pay | Admitting: Podiatry

## 2023-08-17 IMAGING — MG MM DIGITAL SCREENING BILAT W/ TOMO AND CAD
8 series · 8 of 24 positions shown · non-contrast
Comparison: Previous exam(s).

CLINICAL DATA: Screening.

EXAM:
DIGITAL SCREENING BILATERAL MAMMOGRAM WITH TOMOSYNTHESIS AND CAD
TECHNIQUE: Bilateral screening digital craniocaudal and mediolateral oblique
mammograms were obtained. Bilateral screening digital breast
tomosynthesis was performed. The images were evaluated with
computer-aided detection.

[L CC synth-2D]
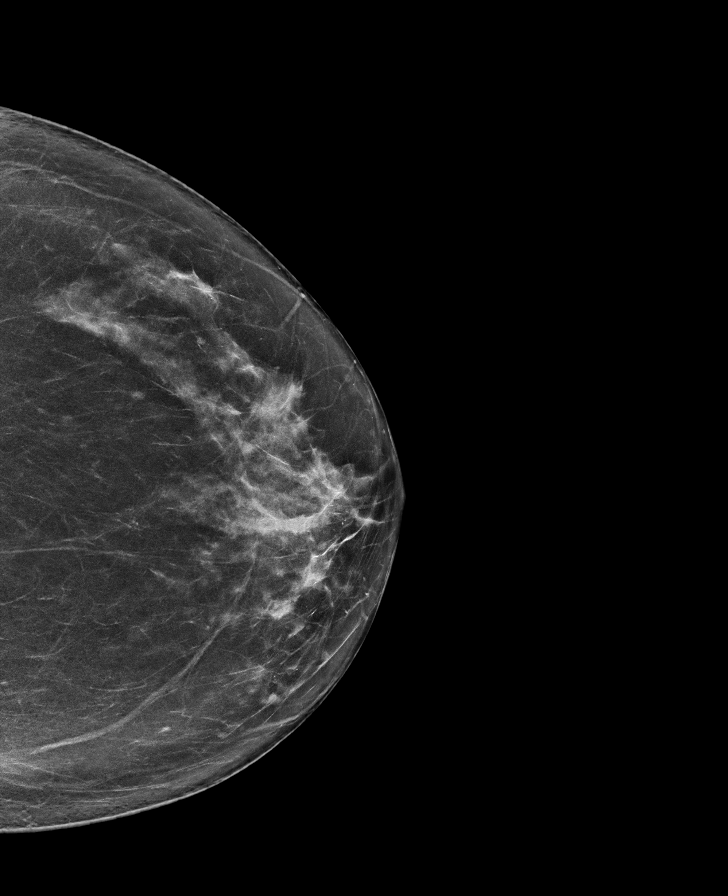

[L MLO synth-2D]
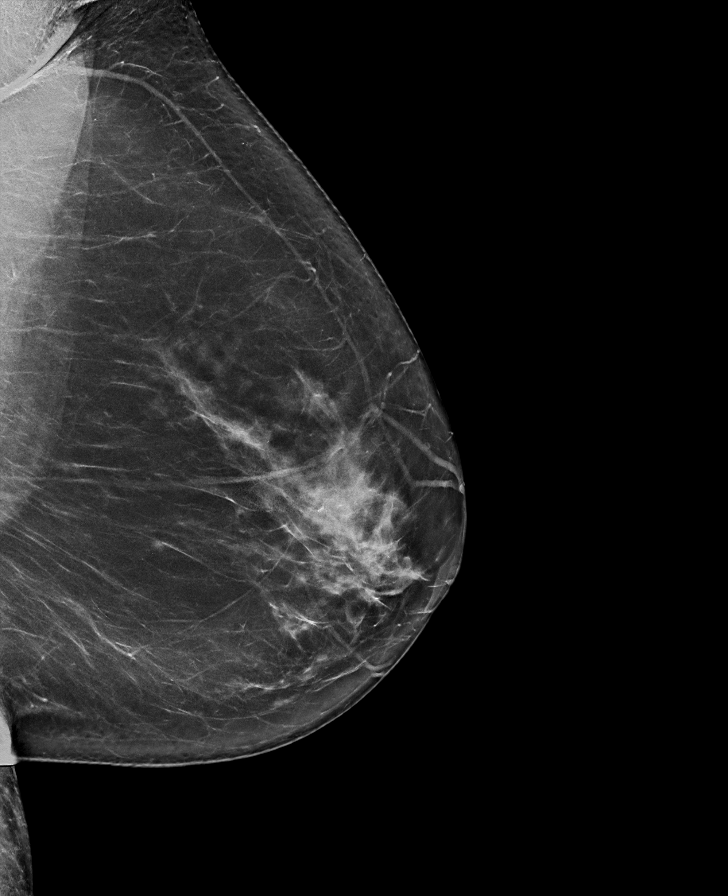

[R MLO synth-2D]
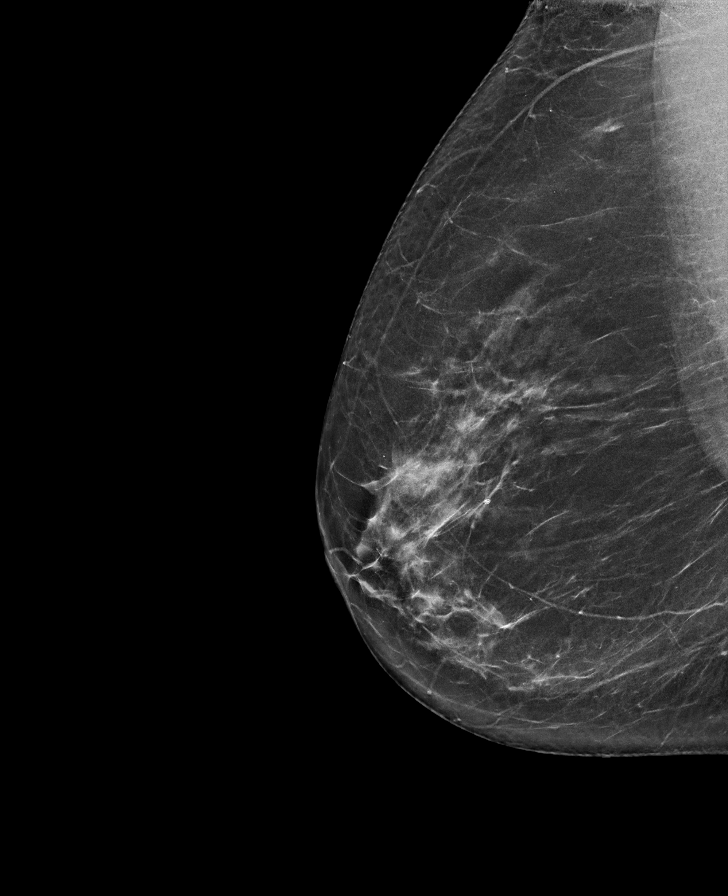

[R CC synth-2D]
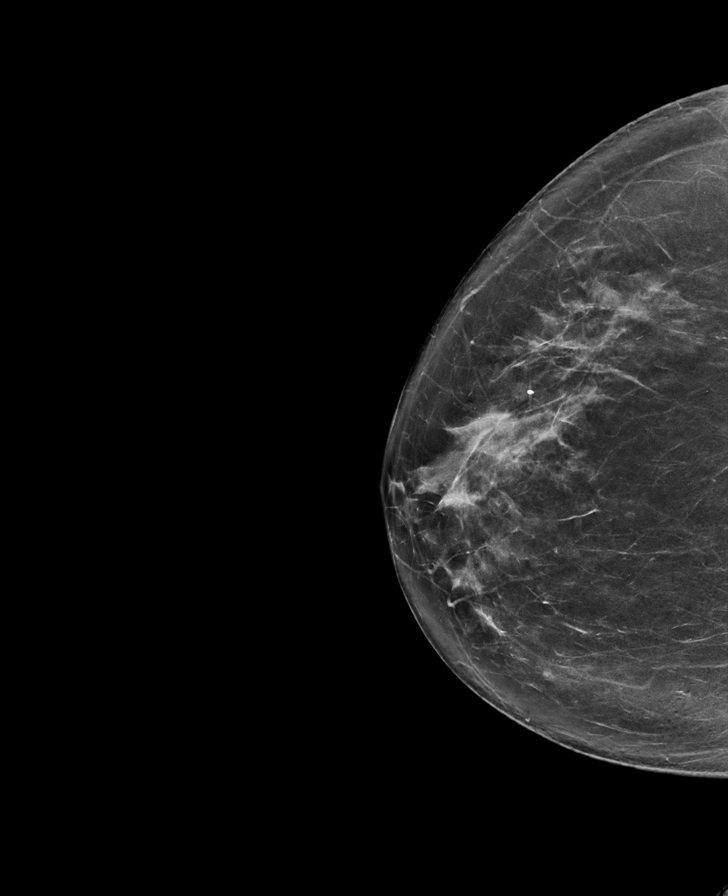

[R MLO tomo · tomo slice 39/77.0]
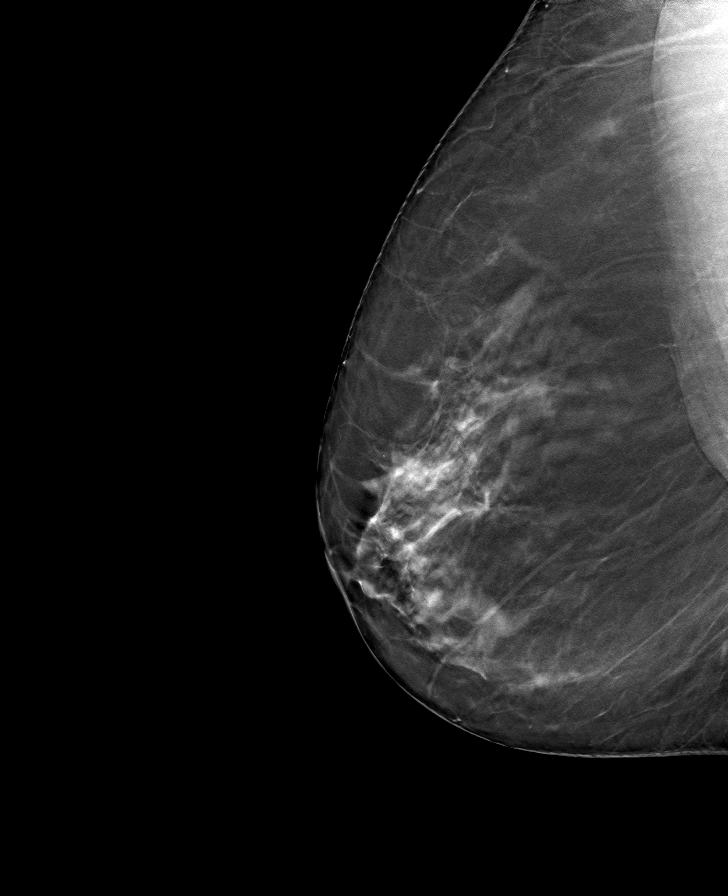

[L MLO tomo · tomo slice 41/81.0]
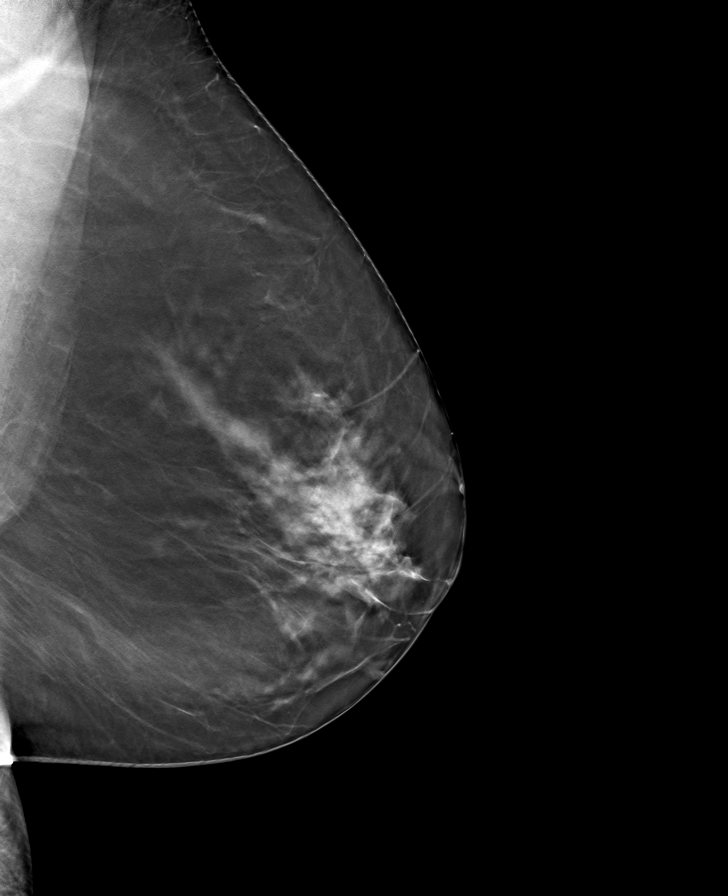

[L CC tomo · tomo slice 37/74.0]
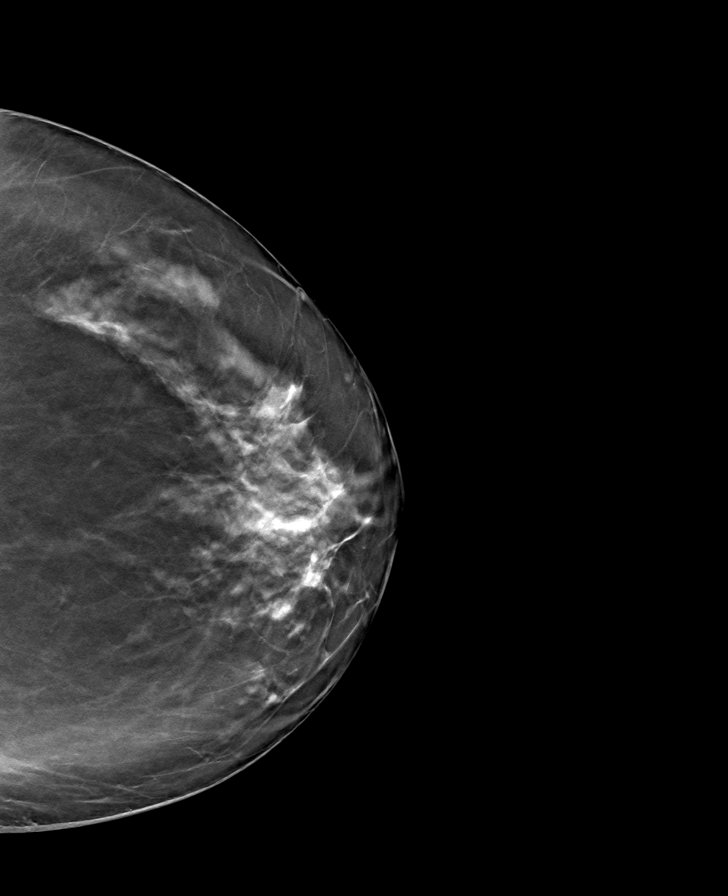

[R CC tomo · tomo slice 39/77.0]
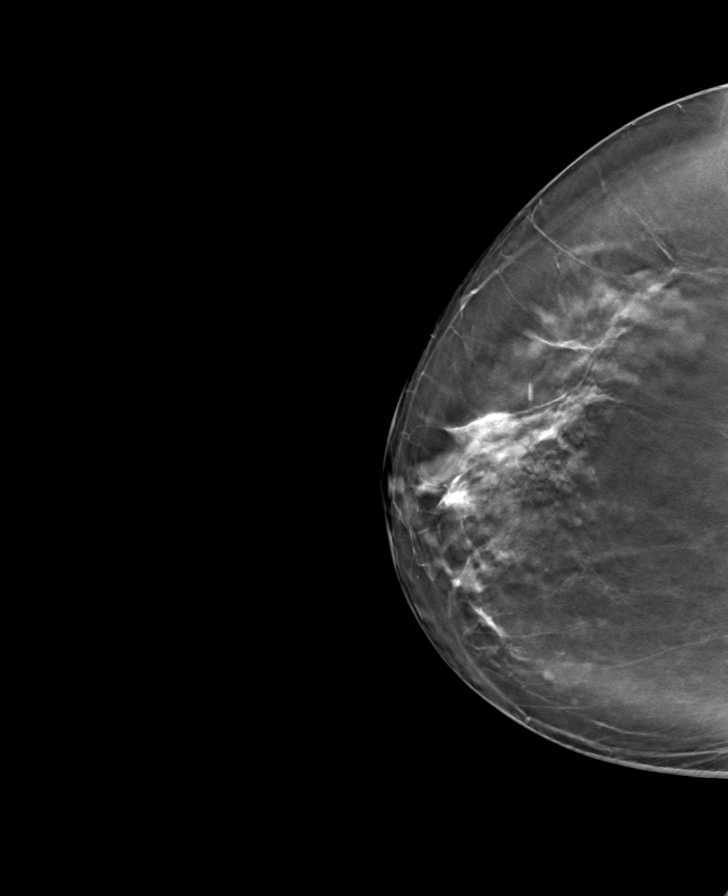

[8 of 24 positions shown; findings below may reference images not displayed]

ACR Breast Density Category c: The breast tissue is heterogeneously
dense, which may obscure small masses.
FINDINGS: There are no findings suspicious for malignancy.
IMPRESSION: No mammographic evidence of malignancy. A result letter of this
screening mammogram will be mailed directly to the patient.

RECOMMENDATION:
Screening mammogram in one year. (Code:[id number])

BI-RADS CATEGORY  1: Negative.

## 2023-08-17 NOTE — Telephone Encounter (Signed)
Called patient in regards to moving surgery to next week. Left voicemail

## 2023-08-24 ENCOUNTER — Other Ambulatory Visit: Payer: Self-pay | Admitting: Podiatry

## 2023-08-24 DIAGNOSIS — M7750 Other enthesopathy of unspecified foot: Secondary | ICD-10-CM

## 2023-08-24 MED ORDER — CEPHALEXIN 500 MG PO CAPS: 500.0000 mg | ORAL_CAPSULE | Freq: Three times a day (TID) | ORAL | 0 refills | Status: AC

## 2023-08-24 MED ORDER — IBUPROFEN 800 MG PO TABS: 800.0000 mg | ORAL_TABLET | Freq: Three times a day (TID) | ORAL | 1 refills | Status: DC | PRN

## 2023-08-24 MED ORDER — OXYCODONE-ACETAMINOPHEN 5-325 MG PO TABS: 1.0000 | ORAL_TABLET | ORAL | 0 refills | Status: DC | PRN

## 2023-08-24 NOTE — Progress Notes (Signed)
Post-op meds sent

## 2023-08-25 ENCOUNTER — Telehealth: Payer: Self-pay | Admitting: Podiatry

## 2023-08-25 NOTE — Telephone Encounter (Signed)
Pt called upset she was scheduled last week for surgery and it got cxled and rescheduled for today and today's was canceled. She has not gotten any information as to why they have been canceled. She has had transportation set up for both appts and now has to find new transportation for the next time she is scheduled. She has left a few messages today.  I did apologize and tell her the surgery coordinator is out of the office today but will call when she is back in the office.

## 2023-08-26 ENCOUNTER — Telehealth: Payer: Self-pay | Admitting: Podiatry

## 2023-08-26 ENCOUNTER — Encounter: Payer: Medicare Other | Admitting: Podiatry

## 2023-08-26 NOTE — Telephone Encounter (Signed)
Pt called this morning and the phones were off due to internet outage. She called back and left message upset that she was to have had surgery last week and it was canceled and then again yesterday her surgery was canceled. She has left several messages for Anisha and has not heard anything back about r/s surgery.  I did apologize and explain that Dr Annamary Rummage is out of the office under the weather. And Ileene Rubens is out yesterday and today as well. Please call her to reschedule the surgery for the pt. I did cxl her post op 1 as she did not have the surgery.  She also got a message about an appt scheduled thru my chart for Dr Logan Bores on 10/30 as a new pt. I cxled that as well. Not sure how it got scheduled thru my chart.   She said thank you so much for giving me the information as to what was going on and she will wait to hear from [place].

## 2023-08-30 ENCOUNTER — Other Ambulatory Visit: Payer: Medicare Other

## 2023-08-31 ENCOUNTER — Telehealth (INDEPENDENT_AMBULATORY_CARE_PROVIDER_SITE_OTHER): Payer: Medicare Other | Admitting: Psychiatry

## 2023-08-31 ENCOUNTER — Encounter: Payer: Self-pay | Admitting: Psychiatry

## 2023-08-31 DIAGNOSIS — F411 Generalized anxiety disorder: Secondary | ICD-10-CM

## 2023-08-31 DIAGNOSIS — F3342 Major depressive disorder, recurrent, in full remission: Secondary | ICD-10-CM | POA: Diagnosis not present
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
## 2023-09-01 ENCOUNTER — Ambulatory Visit: Payer: Self-pay | Admitting: Podiatry

## 2023-09-02 ENCOUNTER — Other Ambulatory Visit (INDEPENDENT_AMBULATORY_CARE_PROVIDER_SITE_OTHER): Payer: Medicare Other | Admitting: Podiatry

## 2023-09-02 ENCOUNTER — Ambulatory Visit: Payer: Medicare Other | Admitting: Podiatry

## 2023-09-02 ENCOUNTER — Encounter: Payer: Medicare Other | Admitting: Podiatry

## 2023-09-02 DIAGNOSIS — Z23 Encounter for immunization: Secondary | ICD-10-CM | POA: Diagnosis not present

## 2023-09-02 MED ORDER — TRIAMCINOLONE ACETONIDE 0.1 % EX CREA: 1.0000 | TOPICAL_CREAM | Freq: Two times a day (BID) | CUTANEOUS | 0 refills | Status: DC

## 2023-09-02 NOTE — Progress Notes (Signed)
Steroid cream sent to pharmacy 

## 2023-09-08 DIAGNOSIS — M25775 Osteophyte, left foot: Secondary | ICD-10-CM | POA: Diagnosis not present

## 2023-09-08 DIAGNOSIS — M25774 Osteophyte, right foot: Secondary | ICD-10-CM | POA: Diagnosis not present

## 2023-09-08 DIAGNOSIS — M85872 Other specified disorders of bone density and structure, left ankle and foot: Secondary | ICD-10-CM | POA: Diagnosis not present

## 2023-09-08 DIAGNOSIS — M85871 Other specified disorders of bone density and structure, right ankle and foot: Secondary | ICD-10-CM | POA: Diagnosis not present

## 2023-09-09 ENCOUNTER — Other Ambulatory Visit: Payer: Medicare Other

## 2023-09-13 ENCOUNTER — Telehealth: Payer: Self-pay

## 2023-09-13 NOTE — Telephone Encounter (Signed)
Patient called, asking if she could get up and move around more - Advised OK to move around some - She will monitor for pain and/or swelling and will sit and elevate feet periodically throughout the day. Advised to please call back with any further questions or problems Thanks

## 2023-09-14 DIAGNOSIS — R509 Fever, unspecified: Secondary | ICD-10-CM | POA: Diagnosis not present

## 2023-09-14 DIAGNOSIS — Z20822 Contact with and (suspected) exposure to covid-19: Secondary | ICD-10-CM | POA: Diagnosis not present

## 2023-09-14 DIAGNOSIS — J069 Acute upper respiratory infection, unspecified: Secondary | ICD-10-CM | POA: Diagnosis not present

## 2023-09-16 ENCOUNTER — Ambulatory Visit (INDEPENDENT_AMBULATORY_CARE_PROVIDER_SITE_OTHER): Payer: Medicare Other

## 2023-09-16 ENCOUNTER — Ambulatory Visit (INDEPENDENT_AMBULATORY_CARE_PROVIDER_SITE_OTHER): Payer: Medicare Other | Admitting: Podiatry

## 2023-09-16 DIAGNOSIS — Z9889 Other specified postprocedural states: Secondary | ICD-10-CM | POA: Diagnosis not present

## 2023-09-16 DIAGNOSIS — M779 Enthesopathy, unspecified: Secondary | ICD-10-CM

## 2023-09-16 DIAGNOSIS — M7750 Other enthesopathy of unspecified foot: Secondary | ICD-10-CM

## 2023-09-16 NOTE — Progress Notes (Signed)
  Subjective:  Patient ID: Elizabeth Hull, female    DOB: 05/17/1951,  MRN: [id number]  Chief Complaint  Patient presents with   Routine Post Op    POV #1 DOS 09/08/2023 BILATERAL MIDFOOT DORSAL EXOSTECTOMY (BONE SPUR RESECTION     DOS: 09/08/2023 Procedure: Bilateral dorsal midfoot exostectomy  72 y.o. female returns for post-op check.  Patient returns for first postop check.  She is 1 week status post above procedures.  Has been walking postop shoes.  Has kept dressing clean clean dry and intact as instructed.  Review of Systems: Negative except as noted in the HPI. Denies N/V/F/Ch.   Objective:  There were no vitals filed for this visit. There is no height or weight on file to calculate BMI. Constitutional Well developed. Well nourished.  Vascular Foot warm and well perfused. Capillary refill normal to all digits.  Calf is soft and supple, no posterior calf or knee pain, negative Homans' sign  Neurologic Normal speech. Oriented to person, place, and time. Epicritic sensation to light touch grossly present bilaterally.  Dermatologic Skin healing well without signs of infection. Skin edges well coapted without signs of infection.  Orthopedic: Tenderness to palpation noted about the surgical site.   Multiple view plain film radiographs: 09/16/2023 XR 3 views AP lateral oblique bilateral foot.  Attention directed to the midfoot at the first TMT J there is been interval resection of dorsal osseous spurring on the dorsal aspect medial cuneiform.  Improved from prior on both radiographs no fracture noted. Assessment:   1. Post-operative state   2. Bone spur of foot    Plan:  Patient was evaluated and treated and all questions answered.  S/p foot surgery bilaterally dorsal midfoot exostectomy -Progressing as expected post-operatively. -XR: No acute postop complication noted -WB Status: Weightbearing as tolerated in bilateral postop shoe -Sutures: To remain intact until next  appointment. -Medications: Ibuprofen and Percocet as per pain -Foot redressed.  No follow-ups on file.         Corinna Gab, DPM [facility] / [facility]

## 2023-09-23 ENCOUNTER — Ambulatory Visit (INDEPENDENT_AMBULATORY_CARE_PROVIDER_SITE_OTHER): Payer: Medicare Other | Admitting: Podiatry

## 2023-09-23 ENCOUNTER — Encounter: Payer: Self-pay | Admitting: Podiatry

## 2023-09-23 DIAGNOSIS — Q6672 Congenital pes cavus, left foot: Secondary | ICD-10-CM

## 2023-09-23 DIAGNOSIS — Z0000 Encounter for general adult medical examination without abnormal findings: Secondary | ICD-10-CM | POA: Diagnosis not present

## 2023-09-23 DIAGNOSIS — Q6671 Congenital pes cavus, right foot: Secondary | ICD-10-CM

## 2023-09-23 DIAGNOSIS — R059 Cough, unspecified: Secondary | ICD-10-CM | POA: Diagnosis not present

## 2023-09-23 DIAGNOSIS — E039 Hypothyroidism, unspecified: Secondary | ICD-10-CM | POA: Diagnosis not present

## 2023-09-23 DIAGNOSIS — E7800 Pure hypercholesterolemia, unspecified: Secondary | ICD-10-CM | POA: Diagnosis not present

## 2023-09-23 DIAGNOSIS — Z1331 Encounter for screening for depression: Secondary | ICD-10-CM | POA: Diagnosis not present

## 2023-09-23 DIAGNOSIS — Z299 Encounter for prophylactic measures, unspecified: Secondary | ICD-10-CM | POA: Diagnosis not present

## 2023-09-23 DIAGNOSIS — Z1339 Encounter for screening examination for other mental health and behavioral disorders: Secondary | ICD-10-CM | POA: Diagnosis not present

## 2023-09-23 DIAGNOSIS — Z79899 Other long term (current) drug therapy: Secondary | ICD-10-CM | POA: Diagnosis not present

## 2023-09-23 DIAGNOSIS — R5383 Other fatigue: Secondary | ICD-10-CM | POA: Diagnosis not present

## 2023-09-23 DIAGNOSIS — Z9889 Other specified postprocedural states: Secondary | ICD-10-CM

## 2023-09-23 DIAGNOSIS — Z7189 Other specified counseling: Secondary | ICD-10-CM | POA: Diagnosis not present

## 2023-09-23 DIAGNOSIS — M7750 Other enthesopathy of unspecified foot: Secondary | ICD-10-CM

## 2023-09-23 NOTE — Progress Notes (Signed)
  Subjective:  Patient ID: Elizabeth Hull, female    DOB: 1951/08/04,  MRN: [id number]  Chief Complaint  Patient presents with   Routine Post Op    POV #2 DOS 09/08/2023 BILATERAL MIDFOOT DORSAL EXOSTECTOMY (BONE SPUR RESECTION "They're doing good I think.  I have shoes on and a pair of socks."      DOS: 09/08/2023 Procedure: Bilateral dorsal midfoot exostectomy  72 y.o. female returns for post-op check.  Patient returns for  postop check.  She is 2 week status post above procedures.  Has been walking in regular shoes denies pain has kept the incision sites clean and dry.  Denies pain doing very well  Review of Systems: Negative except as noted in the HPI. Denies N/V/F/Ch.   Objective:  There were no vitals filed for this visit. There is no height or weight on file to calculate BMI. Constitutional Well developed. Well nourished.  Vascular Foot warm and well perfused. Capillary refill normal to all digits.  Calf is soft and supple, no posterior calf or knee pain, negative Homans' sign  Neurologic Normal speech. Oriented to person, place, and time. Epicritic sensation to light touch grossly present bilaterally.  Dermatologic Skin healing well without signs of infection. Skin edges well coapted without signs of infection.  Orthopedic: Minimal to no tenderness to palpation noted about the surgical site.   Multiple view plain film radiographs: Deferred Assessment:   1. Post-operative state   2. Bone spur of foot   3. Pes cavus of both feet     Plan:  Patient was evaluated and treated and all questions answered.  S/p foot surgery bilaterally dorsal midfoot exostectomy -Progressing as expected post-operatively.  Doing very well overall minimal to no pain at this time back to regular shoe gear -XR: Deferred today -WB Status: Weightbearing as tolerated in regular shoes -Sutures: Removed total at this visit -Medications: As needed ibuprofen -Steri-Strips applied patient is now okay  to get the foot wet and wash in the shower should dry and apply Band-Aid to protect incisions going forward for another week or so -Will proceed with custom-made orthotics patient will schedule appointment with pedorthist to be casted for these  Return in about 4 weeks (around 10/21/2023).         Corinna Gab, DPM [facility] / [facility]

## 2023-10-04 DIAGNOSIS — R0981 Nasal congestion: Secondary | ICD-10-CM | POA: Diagnosis not present

## 2023-10-04 DIAGNOSIS — R059 Cough, unspecified: Secondary | ICD-10-CM | POA: Diagnosis not present

## 2023-10-08 DIAGNOSIS — S4990XA Unspecified injury of shoulder and upper arm, unspecified arm, initial encounter: Secondary | ICD-10-CM | POA: Diagnosis not present

## 2023-10-08 DIAGNOSIS — S43001A Unspecified subluxation of right shoulder joint, initial encounter: Secondary | ICD-10-CM | POA: Diagnosis not present

## 2023-10-08 DIAGNOSIS — Z299 Encounter for prophylactic measures, unspecified: Secondary | ICD-10-CM | POA: Diagnosis not present

## 2023-10-08 DIAGNOSIS — Z9181 History of falling: Secondary | ICD-10-CM | POA: Diagnosis not present

## 2023-10-08 DIAGNOSIS — J3489 Other specified disorders of nose and nasal sinuses: Secondary | ICD-10-CM | POA: Diagnosis not present

## 2023-10-08 DIAGNOSIS — M19011 Primary osteoarthritis, right shoulder: Secondary | ICD-10-CM | POA: Diagnosis not present

## 2023-10-08 DIAGNOSIS — M25511 Pain in right shoulder: Secondary | ICD-10-CM | POA: Diagnosis not present

## 2023-10-20 DIAGNOSIS — M25511 Pain in right shoulder: Secondary | ICD-10-CM | POA: Diagnosis not present

## 2023-10-21 ENCOUNTER — Other Ambulatory Visit: Payer: Self-pay | Admitting: Internal Medicine

## 2023-10-21 ENCOUNTER — Encounter: Payer: Medicare Other | Admitting: Podiatry

## 2023-10-21 DIAGNOSIS — Z1231 Encounter for screening mammogram for malignant neoplasm of breast: Secondary | ICD-10-CM

## 2023-11-04 ENCOUNTER — Encounter: Payer: Medicare Other | Admitting: Podiatry

## 2023-11-06 DIAGNOSIS — R0982 Postnasal drip: Secondary | ICD-10-CM | POA: Diagnosis not present

## 2023-11-06 DIAGNOSIS — R058 Other specified cough: Secondary | ICD-10-CM | POA: Diagnosis not present

## 2023-11-09 ENCOUNTER — Encounter: Payer: Medicare Other | Admitting: Podiatry

## 2023-11-09 ENCOUNTER — Ambulatory Visit
Admission: RE | Admit: 2023-11-09 | Discharge: 2023-11-09 | Disposition: A | Discharge status: Home / Self Care | Payer: Medicare Other | Source: Ambulatory Visit | Attending: Internal Medicine | Admitting: Internal Medicine

## 2023-11-09 DIAGNOSIS — Z1231 Encounter for screening mammogram for malignant neoplasm of breast: Secondary | ICD-10-CM

## 2023-11-15 ENCOUNTER — Other Ambulatory Visit: Payer: Self-pay | Admitting: Psychiatry

## 2023-11-15 DIAGNOSIS — F3342 Major depressive disorder, recurrent, in full remission: Secondary | ICD-10-CM

## 2023-11-18 ENCOUNTER — Ambulatory Visit (INDEPENDENT_AMBULATORY_CARE_PROVIDER_SITE_OTHER): Payer: Medicare Other | Admitting: Podiatry

## 2023-11-18 DIAGNOSIS — Q6671 Congenital pes cavus, right foot: Secondary | ICD-10-CM

## 2023-11-18 DIAGNOSIS — M7750 Other enthesopathy of unspecified foot: Secondary | ICD-10-CM

## 2023-11-18 DIAGNOSIS — Q6672 Congenital pes cavus, left foot: Secondary | ICD-10-CM

## 2023-11-18 DIAGNOSIS — Z9889 Other specified postprocedural states: Secondary | ICD-10-CM

## 2023-11-18 NOTE — Progress Notes (Signed)
  Subjective:  Patient ID: Elizabeth Hull, female    DOB: 06-18-1951,  MRN: [id number]   DOS: 09/08/2023 Procedure: Bilateral dorsal midfoot exostectomy  73 y.o. female returns for post-op check.  Patient returns for  postop check.  She is 8 weeks status post above procedures.  Has been walking in regular shoes.  She reports that the right foot is fully healed no concerns no residual pain.  She does still have residual pain and bump on the left foot however.  She can wear certain shoes including boots but certain other shoes still cause pain on the left foot no issues on the right.  Review of Systems: Negative except as noted in the HPI. Denies N/V/F/Ch.   Objective:  There were no vitals filed for this visit. There is no height or weight on file to calculate BMI. Constitutional Well developed. Well nourished.  Vascular Foot warm and well perfused. Capillary refill normal to all digits.  Calf is soft and supple, no posterior calf or knee pain, negative Homans' sign  Neurologic Normal speech. Oriented to person, place, and time. Epicritic sensation to light touch grossly present bilaterally.  Dermatologic Skin healing well without signs of infection. Skin edges well coapted without signs of infection.  Orthopedic: Right foot without prominence or pain on palpation of the dorsal midfoot.  Left foot with pain focally and a small osseous prominence noted at site of prior exostectomy of the dorsal midfoot.  There is some inflammation around this bump as well.  Significant cavus foot deformity noted bilaterally   Multiple view plain film radiographs: Deferred Assessment:   1. Post-operative state   2. Bone spur of foot   3. Pes cavus of both feet      Plan:  Patient was evaluated and treated and all questions answered.  S/p foot surgery bilaterally dorsal midfoot exostectomy -Progressing as expected post-operatively.  Overall doing well right foot healed with no issues left foot still with  pain and prominence -Discussed that unfortunately her left foot is likely to improve much more at this point in time.   -Options include further surgery to resect more bone versus cavus foot recon versus steroid injection.  Patient opted for steroid injection -After sterile prep injected 1 cc half percent Marcaine plain with 1 cc Kenalog 10 along the dorsal prominence of the left midfoot.  Patient tolerated well -XR: Deferred today -WB Status: Weightbearing as tolerated in regular shoes -Patient to follow-up as needed.  She states she does not want more surgery on the left foot due to cost reasons.         Corinna Gab, DPM [facility] / [facility]

## 2023-12-06 ENCOUNTER — Ambulatory Visit (INDEPENDENT_AMBULATORY_CARE_PROVIDER_SITE_OTHER): Payer: Medicare Other | Admitting: Psychiatry

## 2023-12-06 ENCOUNTER — Encounter: Payer: Self-pay | Admitting: Psychiatry

## 2023-12-06 VITALS — BP 138/84 | HR 88 | Temp 98.7°F | Ht 63.0 in | Wt 141.2 lb

## 2023-12-06 DIAGNOSIS — F3342 Major depressive disorder, recurrent, in full remission: Secondary | ICD-10-CM

## 2023-12-06 DIAGNOSIS — F411 Generalized anxiety disorder: Secondary | ICD-10-CM | POA: Diagnosis not present

## 2023-12-06 MED ORDER — BUSPIRONE HCL 15 MG PO TABS: 45.0000 mg | ORAL_TABLET | ORAL | 1 refills | Status: DC

## 2023-12-06 MED ORDER — TRAZODONE HCL 50 MG PO TABS: 50.0000 mg | ORAL_TABLET | Freq: Every day | ORAL | 1 refills | Status: DC

## 2023-12-06 NOTE — Progress Notes (Unsigned)
error 

## 2023-12-06 NOTE — Progress Notes (Unsigned)
BH MD OP Progress Note  12/06/2023 11:15 AM Elizabeth Hull  MRN:  [id number]  Chief Complaint:  Chief Complaint  Patient presents with   Follow-up   Anxiety   Depression   Medication Refill   HPI: Elizabeth Hull is a 73 year old Caucasian female, married, has a history of GAD, MDD, lives in [place] was evaluated in office today.  She has been experiencing upper respiratory symptoms since before Christmas, which are persistent and recurrent. The symptoms resemble a viral upper respiratory infection that resolves temporarily but recurs. She has been using a prescribed cough medicine for postnasal drip, which provides temporary relief. Over-the-counter medications like Claritin, Zyrtec, and Mucinex have been somewhat effective, but symptoms persist. Sudafed has not been helpful. She has no history of seasonal allergies and is not currently using antibiotics.  She is actively managing her weight, having reduced from 152 pounds in July to 141 pounds currently, with a goal of reaching 140 pounds. She attributes her weight loss to participation in [employer] and expresses satisfaction with her progress, although she finds it challenging to find clothes that fit due to the weight loss.  She is experiencing significant family stressors, including her son's depression following job loss and her granddaughter's behavioral issues. Her son, a former Teacher, early years/pre, is isolating and is resistant to seeking help. Her granddaughter's seizures and behavioral problems have led to family conflicts. She had a significant argument with her husband over a car purchase, resulting in a temporary separation, but they have since reconciled.  She is currently compliant on her medications including Celexa, Wellbutrin, trazodone, BuSpar and hydroxyzine.  Denies side effects.  Patient agreeable to establish care with therapist as needed.  Currently denies any suicidality, homicidality or perceptual disturbances.  Visit  Diagnosis:    ICD-10-CM   1. GAD (generalized anxiety disorder)  F41.1 busPIRone (BUSPAR) 15 MG tablet    traZODone (DESYREL) 50 MG tablet    2. MDD (major depressive disorder), recurrent, in full remission (HCC)  F33.42       Past Psychiatric History: I have reviewed past psychiatric history from progress note on 04/03/2022.  Past trials of Elavil, Xanax, BuSpar.  Past Medical History:  Past Medical History:  Diagnosis Date   Anxiety    Arthritis    Cervical spinal stenosis    Complication of anesthesia    Depression    HNP (herniated nucleus pulposus), cervical    PONV (postoperative nausea and vomiting)    Thyroid condition     Past Surgical History:  Procedure Laterality Date   ANTERIOR CERVICAL DECOMP/DISCECTOMY FUSION N/A 09/16/2018   Procedure: C4-C5 ANTERIOR CERVICAL DECOMPRESSION/DISCECTOMY FUSION, ALLOGRAFT, PLATE;  Surgeon: Eldred Manges, MD;  Location: [facility] OR;  Service: Orthopedics;  Laterality: N/A;   BACK SURGERY  2017   lumbar   BRAIN TUMOR EXCISION     BREAST CYST ASPIRATION     TUBAL LIGATION      Family Psychiatric History: I have reviewed family psychiatric history from progress note on 04/03/2022.  Family History:  Family History  Problem Relation Age of Onset   Depression Brother    Anxiety disorder Brother    Depression Son    Anxiety disorder Son    Bipolar disorder Granddaughter     Social History: I have reviewed social history from progress note on 04/03/2022. Social History   Socioeconomic History   Marital status: Married    Spouse name: Not on file   Number of children: 2   Years  of education: Not on file   Highest education level: High school graduate  Occupational History   Not on file  Tobacco Use   Smoking status: Never   Smokeless tobacco: Never  Vaping Use   Vaping status: Never Used  Substance and Sexual Activity   Alcohol use: Yes    Comment: wine with dinner   Drug use: Never   Sexual activity: Yes  Other Topics Concern    Not on file  Social History Narrative   Not on file   Social Drivers of Health   Financial Resource Strain: Not on file  Food Insecurity: Not on file  Transportation Needs: Not on file  Physical Activity: Not on file  Stress: Not on file  Social Connections: Not on file    Allergies:  Allergies  Allergen Reactions   Hydrocodone Other (See Comments)    Unknown   Nucynta [Tapentadol Hcl] Other (See Comments)    Unknown   Nucynta [Tapentadol]    Other    Sulfa Antibiotics Nausea Only   Codeine Nausea Only and Nausea And Vomiting    Metabolic Disorder Labs: No results found for: "HGBA1C", "MPG" No results found for: "PROLACTIN" No results found for: "CHOL", "TRIG", "HDL", "CHOLHDL", "VLDL", "LDLCALC" No results found for: "TSH"  Therapeutic Level Labs: No results found for: "LITHIUM" No results found for: "VALPROATE" No results found for: "CBMZ"  Current Medications: Current Outpatient Medications  Medication Sig Dispense Refill   buPROPion (WELLBUTRIN XL) 300 MG 24 hr tablet TAKE 1 TABLET(300 MG) BY MOUTH DAILY 90 tablet 1   citalopram (CELEXA) 20 MG tablet Take 1.5 tablets (30 mg total) by mouth daily. 135 tablet 1   fluticasone (FLONASE) 50 MCG/ACT nasal spray Place 2 sprays into both nostrils daily as needed for allergies or rhinitis.     gabapentin (NEURONTIN) 600 MG tablet Take 600 mg by mouth 2 (two) times daily.     ibuprofen (ADVIL) 800 MG tablet Take 1 tablet (800 mg total) by mouth every 8 (eight) hours as needed. 30 tablet 1   KRILL OIL PO Take 1,200 mg by mouth daily.     levothyroxine (SYNTHROID) 88 MCG tablet Take 88 mcg by mouth daily.     Multiple Vitamin (MULTI-VITAMIN DAILY PO) Take by mouth.     oxybutynin (DITROPAN-XL) 5 MG 24 hr tablet Take 5 mg by mouth daily.     RESTASIS 0.05 % ophthalmic emulsion 1 drop 2 (two) times daily.     rosuvastatin (CRESTOR) 5 MG tablet Take 5 mg by mouth at bedtime.  3   triamcinolone cream (KENALOG) 0.1 % Apply 1  Application topically 2 (two) times daily. 30 g 0   busPIRone (BUSPAR) 15 MG tablet Take 3 tablets (45 mg total) by mouth as directed. Take 2 tablets daily morning and 1 tablet daily evening. 270 tablet 1   oxyCODONE-acetaminophen (PERCOCET) 5-325 MG tablet Take 1 tablet by mouth every 4 (four) hours as needed for severe pain (pain score 7-10). 20 tablet 0   traZODone (DESYREL) 50 MG tablet Take 1 tablet (50 mg total) by mouth at bedtime. 90 tablet 1   No current facility-administered medications for this visit.     Musculoskeletal: Strength & Muscle Tone: within normal limits Gait & Station: normal Patient leans: N/A  Psychiatric Specialty Exam: Review of Systems  Psychiatric/Behavioral:  The patient is nervous/anxious.     Blood pressure 138/84, pulse 88, temperature 98.7 F (37.1 C), temperature source Temporal, height 5\' 3"  (1.6  m), weight 141 lb 3.2 oz (64 kg), SpO2 99%.Body mass index is 25.01 kg/m.  General Appearance: Fairly Groomed  Eye Contact:  Fair  Speech:  Clear and Coherent  Volume:  Normal  Mood:  Anxious coping okay  Affect:  Congruent  Thought Process:  Goal Directed and Descriptions of Associations: Intact  Orientation:  Full (Time, Place, and Person)  Thought Content: Logical   Suicidal Thoughts:  No  Homicidal Thoughts:  No  Memory:  Immediate;   Fair Recent;   Fair Remote;   Fair  Judgement:  Fair  Insight:  Fair  Psychomotor Activity:  Normal  Concentration:  Concentration: Fair and Attention Span: Fair  Recall:  [employer] of Knowledge: Fair  Language: Fair  Akathisia:  No  Handed:  Right  AIMS (if indicated): not done  Assets:  Desire for Improvement Housing Intimacy Social Support  ADL's:  Intact  Cognition: WNL  Sleep:  Fair   Screenings: Midwife Visit from 04/03/2022 in [facility]  AIMS Total Score 0      GAD-7    Flowsheet Row Office Visit from 05/25/2023 in [facility] Office Visit from 01/21/2023 in [facility] Office Visit from 12/11/2022 in [facility] Counselor from 12/04/2022 in [facility] Counselor from 11/19/2022 in [facility]  Total GAD-7 Score 0 2 0 0 1      PHQ2-9    Flowsheet Row Office Visit from 05/25/2023 in [facility] Office Visit from 01/21/2023 in [facility] Office Visit from 12/11/2022 in [facility] Counselor from 12/04/2022 in [facility] Counselor from 11/19/2022 in [facility]  PHQ-2 Total Score 0 0 2 0 1  PHQ-9 Total Score 0 3 5 2 3       Flowsheet Row Video Visit from 08/31/2023 in [facility] Office Visit from 05/25/2023 in [facility] Office Visit from 01/21/2023 in [facility]  C-SSRS RISK CATEGORY No Risk No Risk No Risk        Assessment and Plan: Kammi Hechler is a 73 year old Caucasian female who has a history of depression, anxiety, hypothyroidism, degenerative disk disease, history of brain tumor status post surgery was evaluated in office today.  Patient with situational anxiety, discussed assessment and plan as noted below.   Generalized anxiety disorder-stable Currently  does have situational anxiety although managed on medications.  Agreeable to establish care with therapist for continued management. - Continue BuSpar 30 mg daily in the morning and 15 mg daily in the evening. - Continue Celexa 30 mg daily. - Continue Hydroxyzine 12.5-25 mg 3 times a day as needed for anxiety - Refer for CBT,  communicated with staff to schedule this patient with in-house therapist.  MDD in remission Depression managed with Wellbutrin, Buspar, and trazodone. No current suicidal ideation. Recent stressors include family conflicts and a significant argument with her husband. Discussed therapy options, including virtual sessions.   - Continue Celexa 30 mg daily - Continue Wellbutrin XL 300 mg daily - Continue Trazodone 50 mg at bedtime - Consider therapy for additional support   - Discuss therapist referral   Follow-up in clinic in 2 - 3 months or sooner  if needed.  Collaboration of Care: Collaboration of Care: Referral or follow-up with counselor/therapist AEB I have referred patient to establish care with therapist.  Patient/Guardian was advised Release of Information must be obtained prior to any record release in order to collaborate their care with an outside provider. Patient/Guardian was advised if they have not already done so to contact the registration department to sign all necessary forms in order for [place] to release information regarding their care.   Consent: Patient/Guardian gives verbal consent for treatment and assignment of benefits for services provided during this visit. Patient/Guardian expressed understanding and agreed to proceed.   This note was generated in part or whole with voice recognition software. Voice recognition is usually quite accurate but there are transcription errors that can and very often do occur. I apologize for any typographical errors that were not detected and corrected.    Jomarie Longs, MD 12/07/2023, 2:53 PM

## 2023-12-07 ENCOUNTER — Other Ambulatory Visit: Payer: Self-pay | Admitting: Psychiatry

## 2023-12-07 DIAGNOSIS — F411 Generalized anxiety disorder: Secondary | ICD-10-CM

## 2024-01-10 ENCOUNTER — Ambulatory Visit (INDEPENDENT_AMBULATORY_CARE_PROVIDER_SITE_OTHER): Payer: Self-pay | Admitting: Professional Counselor

## 2024-01-10 DIAGNOSIS — Z91199 Patient's noncompliance with other medical treatment and regimen due to unspecified reason: Secondary | ICD-10-CM

## 2024-01-10 NOTE — Progress Notes (Signed)
 Patient no-showed today's appointment; appointment was for 01/10/24 at 10 AM to establish outpatient therapy services.

## 2024-01-28 DIAGNOSIS — M7062 Trochanteric bursitis, left hip: Secondary | ICD-10-CM | POA: Diagnosis not present

## 2024-01-28 DIAGNOSIS — R52 Pain, unspecified: Secondary | ICD-10-CM | POA: Diagnosis not present

## 2024-01-28 DIAGNOSIS — F339 Major depressive disorder, recurrent, unspecified: Secondary | ICD-10-CM | POA: Diagnosis not present

## 2024-01-28 DIAGNOSIS — Z299 Encounter for prophylactic measures, unspecified: Secondary | ICD-10-CM | POA: Diagnosis not present

## 2024-01-28 DIAGNOSIS — M810 Age-related osteoporosis without current pathological fracture: Secondary | ICD-10-CM | POA: Diagnosis not present

## 2024-02-23 DIAGNOSIS — H43813 Vitreous degeneration, bilateral: Secondary | ICD-10-CM | POA: Diagnosis not present

## 2024-03-07 ENCOUNTER — Telehealth (INDEPENDENT_AMBULATORY_CARE_PROVIDER_SITE_OTHER): Payer: Medicare Other | Admitting: Psychiatry

## 2024-03-07 ENCOUNTER — Encounter: Payer: Self-pay | Admitting: Psychiatry

## 2024-03-07 DIAGNOSIS — F3342 Major depressive disorder, recurrent, in full remission: Secondary | ICD-10-CM | POA: Diagnosis not present

## 2024-03-07 DIAGNOSIS — F411 Generalized anxiety disorder: Secondary | ICD-10-CM

## 2024-03-07 MED ORDER — CITALOPRAM HYDROBROMIDE 20 MG PO TABS: 20.0000 mg | ORAL_TABLET | Freq: Every day | ORAL | Status: DC

## 2024-03-07 NOTE — Progress Notes (Signed)
 Virtual Visit via Video Note  I connected with Elizabeth Hull on 03/07/24 at 11:00 AM EDT by a video enabled telemedicine application and verified that I am speaking with the correct person using two identifiers.  Location Provider Location : [facility] Patient Location : Home  Participants: Patient , Provider    I discussed the limitations of evaluation and management by telemedicine and the availability of in person appointments. The patient expressed understanding and agreed to proceed.   I discussed the assessment and treatment plan with the patient. The patient was provided an opportunity to ask questions and all were answered. The patient agreed with the plan and demonstrated an understanding of the instructions.   The patient was advised to call back or seek an in-person evaluation if the symptoms worsen or if the condition fails to improve as anticipated.                                                                                              BH MD OP Progress Note  03/07/2024 11:22 AM Elizabeth Hull  MRN:  [id number]  Chief Complaint:  Chief Complaint  Patient presents with   Follow-up   Anxiety   Medication Refill   Discussed the use of AI scribe software for clinical note transcription with the patient, who gave verbal consent to proceed.  History of Present Illness Elizabeth Hull is a 73 year old Caucasian female, married, homemaker, has a history of GAD, MDD,lives in [place] who presents for a psychiatric follow-up visit by telemedicine   She currently reports her current medications as beneficial for her mood symptoms.  She denies any significant sadness, anxiety symptoms at this time.  She is able to cope with her situational stressors better than before.  She sometimes feels unmotivated and prefers to stay on the couch, though these episodes are infrequent and happens only once every few months. She feels her medications are effective.  Her weight has been stable around 142  pounds, slightly below her initial goal of 150 pounds, with fluctuations between 140.8 and 143 pounds since February.  She is currently compliant on all her medications and has been taking only a lower dose of Celexa  previously prescribed as 30 mg however has been using only 20 mg daily.  She reports current dosage has been helpful.  She would like to stay on this dose at this time.  She enjoys gardening both at home and in the [employer] garden, finding it therapeutic. She actively participates in planting and maintaining her garden, using rainwater collected in barrels for irrigation.  She looks forward to her upcoming appointment with therapist to start CBT.  She denies any suicidality, homicidality or perceptual disturbances.  Visit Diagnosis:    ICD-10-CM   1. GAD (generalized anxiety disorder)  F41.1 citalopram  (CELEXA ) 20 MG tablet    2. MDD (major depressive disorder), recurrent, in full remission (HCC)  F33.42       Past Psychiatric History: I have reviewed past psychiatric history from progress note on 04/03/2022.  Past trials of Elavil , Xanax, BuSpar   Past Medical History:  Past Medical History:  Diagnosis Date   Anxiety    Arthritis    Cervical spinal stenosis    Complication of anesthesia    Depression    HNP (herniated nucleus pulposus), cervical    PONV (postoperative nausea and vomiting)    Thyroid  condition     Past Surgical History:  Procedure Laterality Date   ANTERIOR CERVICAL DECOMP/DISCECTOMY FUSION N/A 09/16/2018   Procedure: C4-C5 ANTERIOR CERVICAL DECOMPRESSION/DISCECTOMY FUSION, ALLOGRAFT, PLATE;  Surgeon: Adah Acron, MD;  Location: [facility] OR;  Service: Orthopedics;  Laterality: N/A;   BACK SURGERY  2017   lumbar   BRAIN TUMOR EXCISION     BREAST CYST ASPIRATION     TUBAL LIGATION      Family Psychiatric History: I have reviewed family psychiatric history from progress note on 04/03/2022.  Family History:  Family History  Problem Relation  Age of Onset   Depression Brother    Anxiety disorder Brother    Depression Son    Anxiety disorder Son    Bipolar disorder Granddaughter     Social History: I have reviewed social history from progress note on 04/03/2022. Social History   Socioeconomic History   Marital status: Married    Spouse name: Not on file   Number of children: 2   Years of education: Not on file   Highest education level: High school graduate  Occupational History   Not on file  Tobacco Use   Smoking status: Never   Smokeless tobacco: Never  Vaping Use   Vaping status: Never Used  Substance and Sexual Activity   Alcohol  use: Yes    Comment: wine with dinner   Drug use: Never   Sexual activity: Yes  Other Topics Concern   Not on file  Social History Narrative   Not on file   Social Drivers of Health   Financial Resource Strain: Not on file  Food Insecurity: Not on file  Transportation Needs: Not on file  Physical Activity: Not on file  Stress: Not on file  Social Connections: Not on file    Allergies:  Allergies  Allergen Reactions   Hydrocodone Other (See Comments)    Unknown   Nucynta [Tapentadol Hcl] Other (See Comments)    Unknown   Nucynta [Tapentadol]    Other    Sulfa Antibiotics Nausea Only   Codeine Nausea Only and Nausea And Vomiting    Metabolic Disorder Labs: No results found for: "HGBA1C", "MPG" No results found for: "PROLACTIN" No results found for: "CHOL", "TRIG", "HDL", "CHOLHDL", "VLDL", "LDLCALC" No results found for: "TSH"  Therapeutic Level Labs: No results found for: "LITHIUM" No results found for: "VALPROATE" No results found for: "CBMZ"  Current Medications: Current Outpatient Medications  Medication Sig Dispense Refill   buPROPion  (WELLBUTRIN  XL) 300 MG 24 hr tablet TAKE 1 TABLET(300 MG) BY MOUTH DAILY 90 tablet 1   busPIRone  (BUSPAR ) 15 MG tablet Take 3 tablets (45 mg total) by mouth as directed. Take 2 tablets daily morning and 1 tablet daily  evening. 270 tablet 1   fluticasone  (FLONASE ) 50 MCG/ACT nasal spray Place 2 sprays into both nostrils daily as needed for allergies or rhinitis.     gabapentin  (NEURONTIN ) 600 MG tablet Take 600 mg by mouth 2 (two) times daily.     ibuprofen  (ADVIL ) 800 MG tablet Take 1 tablet (800 mg total) by mouth every 8 (eight) hours as needed. 30 tablet 1   KRILL OIL PO Take 1,200 mg by mouth daily.  levothyroxine  (SYNTHROID ) 88 MCG tablet Take 88 mcg by mouth daily.     Multiple Vitamin (MULTI-VITAMIN DAILY PO) Take by mouth.     oxybutynin (DITROPAN-XL) 5 MG 24 hr tablet Take 5 mg by mouth daily.     rosuvastatin  (CRESTOR ) 5 MG tablet Take 5 mg by mouth at bedtime.  3   traZODone  (DESYREL ) 50 MG tablet Take 1 tablet (50 mg total) by mouth at bedtime. 90 tablet 1   triamcinolone  cream (KENALOG ) 0.1 % Apply 1 Application topically 2 (two) times daily. 30 g 0   citalopram  (CELEXA ) 20 MG tablet Take 1 tablet (20 mg total) by mouth daily.     No current facility-administered medications for this visit.     Musculoskeletal: Strength & Muscle Tone:  UTA Gait & Station:  Seated Patient leans: N/A  Psychiatric Specialty Exam: Review of Systems  Psychiatric/Behavioral: Negative.      There were no vitals taken for this visit.There is no height or weight on file to calculate BMI.  General Appearance: Casual  Eye Contact:  Good  Speech:  Clear and Coherent  Volume:  Normal  Mood:  Euthymic  Affect:  Congruent  Thought Process:  Goal Directed and Descriptions of Associations: Intact  Orientation:  Full (Time, Place, and Person)  Thought Content: Logical   Suicidal Thoughts:  No  Homicidal Thoughts:  No  Memory:  Immediate;   Fair Recent;   Fair Remote;   Fair  Judgement:  Fair  Insight:  Fair  Psychomotor Activity:  Normal  Concentration:  Concentration: Fair and Attention Span: Fair  Recall:  [employer] of Knowledge: Fair  Language: Fair  Akathisia:  No  Handed:  Right  AIMS (if  indicated): not done  Assets:  Desire for Improvement Housing Intimacy Social Support Talents/Skills Transportation  ADL's:  Intact  Cognition: WNL  Sleep:  Fair   Screenings: Midwife Visit from 04/03/2022 in [facility]  AIMS Total Score 0      GAD-7    Flowsheet Row Office Visit from 05/25/2023 in [facility] Office Visit from 01/21/2023 in [facility] Office Visit from 12/11/2022 in [facility] Counselor from 12/04/2022 in [facility] Counselor from 11/19/2022 in [facility]  Total GAD-7 Score 0 2 0 0 1      PHQ2-9    Flowsheet Row Video Visit from 03/07/2024 in [facility] Office Visit from 05/25/2023 in [facility] Office Visit from 01/21/2023 in [facility] Office Visit from 12/11/2022 in [facility] Counselor from 12/04/2022 in [facility]  PHQ-2 Total Score 0 0 0 2 0  PHQ-9 Total Score -- 0 3 5 2       Flowsheet Row Video Visit from 03/07/2024 in [facility] Video Visit from 08/31/2023 in [facility] Office Visit from 05/25/2023 in [facility]  C-SSRS RISK CATEGORY Moderate Risk No Risk No Risk        Assessment and Plan: Elizabeth Hull is a 73 year old Caucasian female with history of anxiety, depression, presented for a follow-up appointment, discussed assessment and plan as noted below.  Generalized anxiety disorder-stable Currently anxiety well managed on current medication regimen.  Taking only a  lower dosage of Celexa  and is doing fairly well.  Motivated to start psychotherapy sessions and has upcoming appointment. - Change Celexa  20 mg daily. - Continue BuSpar  30 mg in the morning and 15 mg in the evening - Continue Hydroxyzine  12.5-25 mg 3 times a day as needed for anxiety - Continue CBT-with in-house therapist has upcoming appointment.  MDD in remission Depression well managed on the current combination of medications including Wellbutrin , Celexa , BuSpar .  Reports sleep and appetite is fair. - Continue Celexa  20 mg daily-reduced dosage - Continue Wellbutrin  XL 300 mg daily - Continue Trazodone  50 mg at bedtime - Patient encouraged to start CBT has upcoming appointment.  Patient had recent labs completed at primary care provider's office.  Will benefit from repeat TSH, sodium level, platelet counts monitoring.  Patient to sign an ROI to obtain most recent labs.  I have also communicated with staff.  Follow-up Follow-up in clinic in 4 to 5 months or sooner if needed.   Collaboration of Care: Collaboration of Care: Referral or follow-up with counselor/therapist AEB patient encouraged to start CBT has upcoming appointment.  Patient/Guardian was advised Release of Information must be obtained prior to any record release in order to collaborate their care with an outside provider. Patient/Guardian was advised if they have not already done so to contact the registration department to sign all necessary forms in order for us  to release information regarding their care.   Consent: Patient/Guardian gives verbal consent for treatment and assignment of benefits for services provided during this visit. Patient/Guardian expressed understanding and agreed to proceed.  This note was generated in part or whole with voice recognition software. Voice recognition is usually quite accurate but there are transcription errors that can and very often do occur. I apologize for any typographical errors  that were not detected and corrected.     Elizabeth Trawick, MD 03/07/2024, 11:22 AM

## 2024-03-16 ENCOUNTER — Ambulatory Visit: Admitting: Professional Counselor

## 2024-03-16 DIAGNOSIS — F411 Generalized anxiety disorder: Secondary | ICD-10-CM | POA: Diagnosis not present

## 2024-03-16 DIAGNOSIS — F3341 Major depressive disorder, recurrent, in partial remission: Secondary | ICD-10-CM | POA: Diagnosis not present
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
## 2024-04-28 ENCOUNTER — Ambulatory Visit (INDEPENDENT_AMBULATORY_CARE_PROVIDER_SITE_OTHER): Admitting: Professional Counselor

## 2024-04-28 DIAGNOSIS — F411 Generalized anxiety disorder: Secondary | ICD-10-CM

## 2024-04-28 DIAGNOSIS — F3341 Major depressive disorder, recurrent, in partial remission: Secondary | ICD-10-CM | POA: Diagnosis not present

## 2024-04-28 NOTE — Progress Notes (Unsigned)
 THERAPIST PROGRESS NOTE  Virtual Visit via Video Note  I connected with Elizabeth Hull on 04/30/24 at 11:00 AM EDT by a video enabled telemedicine application and verified that I am speaking with the correct person using two identifiers.  Location: Patient: Home Provider: Remote office   I discussed the limitations of evaluation and management by telemedicine and the availability of in person appointments. The patient expressed understanding and agreed to proceed.   I discussed the assessment and treatment plan with the patient. The patient was provided an opportunity to ask questions and all were answered. The patient agreed with the plan and demonstrated an understanding of the instructions.   The patient was advised to call back or seek an in-person evaluation if the symptoms worsen or if the condition fails to improve as anticipated.  I provided 52 minutes of non-face-to-face time during this encounter. Elizabeth Hull, [facility]  Session Time: 11:01 AM - 11:53 AM   Participation Level: Active  Behavioral Response: Well Groomed, Alert, Anxious  Type of Therapy: Individual Therapy  Treatment Goals addressed: Active Anxiety  LTG: I would like to be able to understand why I have such low esteem. I've always had low esteem and still do. I don't think I'm as good as someone else. I would love to learn how to be less judgmental of people. To be able to handle my anxiety.     Start:  04/28/24    Expected End:  04/27/25     STG: To reduce sxs of anxiety AEB reduction in GAD7 scores and use of coping mechanisms over the next 90 days.   STG: To improve self-esteem AEB identifying and restructuring negative core beliefs about self over the next 90 days.    STG: To improve relationship satisfaction AEB reduction in judgmentalness and engaging in intimacy building exercises over the next 90 days.    ProgressTowards Goals: Initial  Interventions: Motivational Interviewing and  Supportive  Summary: Eleina Jergens is a 73 y.o. female who presents with a history of anxiety and depression. She appeared alert and oriented x5. She reported she has been worried about her son due to his marital issues. She reported that she will be going to pick up her grandchildren today and her and her husband will be keeping them for a week. She expressed concerns about this. Debroah noted her struggle with some of her husband's behaviors. She engaged in developing her treatment plan. She appeared motivated to begin working towards these goals next session.   Therapist Response: Conducted session with Kathrina. Began session with check-in/update since previous session. Utilized empathetic and reflective listening. Used open-ended questions to facilitate discussion and summarized Sandia's thoughts/feelings. Developed treatment plan with input from Wenden on current strengths, needs, and progress towards goals. Scheduled additional appointment and concluded session.   Suicidal/Homicidal: No  Plan: Return again in 4 weeks.  Diagnosis: GAD (generalized anxiety disorder)  MDD (major depressive disorder), recurrent, in partial remission (HCC)  Collaboration of Care: Medication Management AEB chart review  Patient/Guardian was advised Release of Information must be obtained prior to any record release in order to collaborate their care with an outside provider. Patient/Guardian was advised if they have not already done so to contact the registration department to sign all necessary forms in order for us  to release information regarding their care.   Consent: Patient/Guardian gives verbal consent for treatment and assignment of benefits for services provided during this visit. Patient/Guardian expressed understanding and agreed to proceed.   Elizabeth  JONETTA Hull, [facility] 04/30/2024

## 2024-05-10 ENCOUNTER — Other Ambulatory Visit: Payer: Self-pay | Admitting: Psychiatry

## 2024-05-10 DIAGNOSIS — F3342 Major depressive disorder, recurrent, in full remission: Secondary | ICD-10-CM

## 2024-05-12 ENCOUNTER — Ambulatory Visit: Admitting: Professional Counselor

## 2024-05-26 ENCOUNTER — Ambulatory Visit: Admitting: Professional Counselor

## 2024-05-26 DIAGNOSIS — N3281 Overactive bladder: Secondary | ICD-10-CM | POA: Diagnosis not present

## 2024-05-26 DIAGNOSIS — F3341 Major depressive disorder, recurrent, in partial remission: Secondary | ICD-10-CM

## 2024-05-26 DIAGNOSIS — M810 Age-related osteoporosis without current pathological fracture: Secondary | ICD-10-CM | POA: Diagnosis not present

## 2024-05-26 DIAGNOSIS — Z299 Encounter for prophylactic measures, unspecified: Secondary | ICD-10-CM | POA: Diagnosis not present

## 2024-05-26 DIAGNOSIS — F411 Generalized anxiety disorder: Secondary | ICD-10-CM | POA: Diagnosis not present

## 2024-05-26 NOTE — Progress Notes (Signed)
 THERAPIST PROGRESS NOTE  Virtual Visit via Video Note  I connected with Elizabeth Hull on 05/26/24 at 11:00 AM EDT by a video enabled telemedicine application and verified that I am speaking with the correct person using two identifiers.  Location: Patient: Home Provider: Remote office   I discussed the limitations of evaluation and management by telemedicine and the availability of in person appointments. The patient expressed understanding and agreed to proceed.   I discussed the assessment and treatment plan with the patient. The patient was provided an opportunity to ask questions and all were answered. The patient agreed with the plan and demonstrated an understanding of the instructions.   The patient was advised to call back or seek an in-person evaluation if the symptoms worsen or if the condition fails to improve as anticipated.  I provided 47 minutes of non-face-to-face time during this encounter. Elizabeth Hull, [facility]  Session Time: 11:01 AM - 11:48 AM   Participation Level: Active  Behavioral Response: Well Groomed, Alert, Euthymic  Type of Therapy: Individual Therapy  Treatment Goals addressed: Active Anxiety  LTG: I would like to be able to understand why I have such low esteem. I've always had low esteem and still do. I don't think I'm as good as someone else. I would love to learn how to be less judgmental of people. To be able to handle my anxiety.                 Start:  04/28/24    Expected End:  04/27/25      STG: To reduce sxs of anxiety AEB reduction in GAD7 scores and use of coping mechanisms over the next 90 days.    STG: To improve self-esteem AEB identifying and restructuring negative core beliefs about self over the next 90 days.     STG: To improve relationship satisfaction AEB reduction in judgmentalness and engaging in intimacy building exercises over the next 90 days.    ProgressTowards Goals: Progressing  Interventions: Motivational  Interviewing, Supportive, and Reframing  Summary: Elizabeth Hull is a 73 y.o. female who presents with a history of anxiety and depression. She appeared alert and oriented x5. She reported they enjoyed the week with their grandchildren, although it was tiring. Elizabeth Hull discussed her lifetime struggle with her self-image/esteem. She is unsure where this came from but noted her mother wasn't affectionate with words or actions. She engaged in reframing old thoughts into positive affirmations she can begin practicing. She was receptive to body positivity exercise as well. Elizabeth Hull briefly discussed childhood trauma and upcoming plans with church.   Therapist Response: Conducted session with Elizabeth Hull. Began session with check-in/update since previous session. Utilized empathetic and reflective listening. Used open-ended questions to facilitate discussion and summarized thoughts/feelings. Assisted with reframing old negative thoughts into positive affirmations. Explained body positivity exercise to practice gratitude towards self. Scheduled additional appointment and concluded session.   Suicidal/Homicidal: No  Plan: Return again in 2 weeks.  Diagnosis: GAD (generalized anxiety disorder)  MDD (major depressive disorder), recurrent, in partial remission (HCC)  Collaboration of Care: Medication Management AEB chart review  Patient/Guardian was advised Release of Information must be obtained prior to any record release in order to collaborate their care with an outside provider. Patient/Guardian was advised if they have not already done so to contact the registration department to sign all necessary forms in order for us  to release information regarding their care.   Consent: Patient/Guardian gives verbal consent for treatment and assignment of benefits  for services provided during this visit. Patient/Guardian expressed understanding and agreed to proceed.   Elizabeth Hull, [facility] 05/26/2024

## 2024-05-29 DIAGNOSIS — H16143 Punctate keratitis, bilateral: Secondary | ICD-10-CM | POA: Diagnosis not present

## 2024-05-30 ENCOUNTER — Other Ambulatory Visit: Payer: Self-pay | Admitting: Psychiatry

## 2024-05-30 DIAGNOSIS — F411 Generalized anxiety disorder: Secondary | ICD-10-CM

## 2024-06-04 ENCOUNTER — Other Ambulatory Visit: Payer: Self-pay | Admitting: Psychiatry

## 2024-06-04 DIAGNOSIS — F411 Generalized anxiety disorder: Secondary | ICD-10-CM

## 2024-06-09 ENCOUNTER — Ambulatory Visit: Admitting: Professional Counselor

## 2024-06-09 DIAGNOSIS — F411 Generalized anxiety disorder: Secondary | ICD-10-CM

## 2024-06-09 DIAGNOSIS — F331 Major depressive disorder, recurrent, moderate: Secondary | ICD-10-CM | POA: Diagnosis not present

## 2024-06-09 NOTE — Progress Notes (Signed)
 THERAPIST PROGRESS NOTE  Virtual Visit via Video Note  I connected with Elizabeth Hull on 06/09/24 at  8:00 AM EDT by a video enabled telemedicine application and verified that I am speaking with the correct person using two identifiers.  Location: Patient: Home Provider: Remote office   I discussed the limitations of evaluation and management by telemedicine and the availability of in person appointments. The patient expressed understanding and agreed to proceed.   I discussed the assessment and treatment plan with the patient. The patient was provided an opportunity to ask questions and all were answered. The patient agreed with the plan and demonstrated an understanding of the instructions.   The patient was advised to call back or seek an in-person evaluation if the symptoms worsen or if the condition fails to improve as anticipated.  I provided 55 minutes of non-face-to-face time during this encounter. [name], [facility]  Session Time: 8:04 AM - 8:59 AM  Participation Level: Active  Behavioral Response: Casual, Alert, Depressed  Type of Therapy: Individual Therapy  Treatment Goals addressed: Active Anxiety  LTG: I would like to be able to understand why I have such low esteem. I've always had low esteem and still do. I don't think I'm as good as someone else. I would love to learn how to be less judgmental of people. To be able to handle my anxiety.                 Start:  04/28/24    Expected End:  04/27/25      STG: To reduce sxs of anxiety AEB reduction in GAD7 scores and use of coping mechanisms over the next 90 days.    STG: To improve self-esteem AEB identifying and restructuring negative core beliefs about self over the next 90 days.     STG: To improve relationship satisfaction AEB reduction in judgmentalness and engaging in intimacy building exercises over the next 90 days.    ProgressTowards Goals: Progressing  Interventions: CBT, Motivational  Interviewing, and Supportive  Summary: Elizabeth Hull is a 73 y.o. female who presents with a history of anxiety and depression. She appeared somber but oriented x5. She reported depressive symptoms and fears having a full-blown depressive episode like she's experienced in the past. Elizabeth Hull was receptive to coping skills to help alleviate depression symptoms. She discussed her marriage and concerns about her husband. She engaged in writing exercise to identify things within and outside of her control. She will try to use this to change her response to the situation.   Therapist Response: Conducted session with Elizabeth Hull. Began session with check-in/update since previous session. Utilized empathetic and reflective listening. Explained coping skills for depression (behavior activation, social support, gratitude, and mindfulness). Used open-ended questions to facilitate discussion and summarized Elizabeth Hull's thoughts/feelings. Engaged Elizabeth Hull in writing exercise - donut/circles of control. Assisted with identifying things within and outside of her control. Encouraged Elizabeth Hull to focus on things within her control. Scheduled additional appointment and concluded session.   Suicidal/Homicidal: No  Plan: Return again in 4 weeks.  Diagnosis: MDD (major depressive disorder), recurrent episode, moderate (HCC)  GAD (generalized anxiety disorder)  Collaboration of Care: Medication Management AEB chart review  Patient/Guardian was advised Release of Information must be obtained prior to any record release in order to collaborate their care with an outside provider. Patient/Guardian was advised if they have not already done so to contact the registration department to sign all necessary forms in order for us  to release information regarding  their care.   Consent: Patient/Guardian gives verbal consent for treatment and assignment of benefits for services provided during this visit. Patient/Guardian expressed understanding and agreed to  proceed.   [name], [facility] 06/09/2024

## 2024-06-12 ENCOUNTER — Telehealth: Payer: Self-pay

## 2024-06-12 DIAGNOSIS — F411 Generalized anxiety disorder: Secondary | ICD-10-CM

## 2024-06-12 MED ORDER — HYDROXYZINE HCL 25 MG PO TABS: 25.0000 mg | ORAL_TABLET | Freq: Two times a day (BID) | ORAL | 0 refills | Status: AC | PRN

## 2024-06-12 NOTE — Telephone Encounter (Signed)
 I can call in a low dose of Hydroxyzine  25 mg as needed for anxiety. Please advise her to to limit use and watch for side effects like drowsiness, sedation, dry mouth , constipation and so on. If she agrees please let me know.

## 2024-06-12 NOTE — Telephone Encounter (Signed)
 pt left a message that she is having anxiety really bad. pt asked if something can be called in short term. pt was last seen on 5-6 next appt 10-14

## 2024-06-12 NOTE — Telephone Encounter (Signed)
 Pt notified please send to pharmacy

## 2024-06-12 NOTE — Telephone Encounter (Signed)
 I have sent hydroxyzine  to pharmacy as discussed.

## 2024-06-14 DIAGNOSIS — M19011 Primary osteoarthritis, right shoulder: Secondary | ICD-10-CM | POA: Diagnosis not present

## 2024-06-30 DIAGNOSIS — N3281 Overactive bladder: Secondary | ICD-10-CM | POA: Diagnosis not present

## 2024-06-30 DIAGNOSIS — M25511 Pain in right shoulder: Secondary | ICD-10-CM | POA: Diagnosis not present

## 2024-06-30 DIAGNOSIS — Z299 Encounter for prophylactic measures, unspecified: Secondary | ICD-10-CM | POA: Diagnosis not present

## 2024-06-30 DIAGNOSIS — R52 Pain, unspecified: Secondary | ICD-10-CM | POA: Diagnosis not present

## 2024-07-05 ENCOUNTER — Ambulatory Visit (INDEPENDENT_AMBULATORY_CARE_PROVIDER_SITE_OTHER): Admitting: Professional Counselor

## 2024-07-05 DIAGNOSIS — F411 Generalized anxiety disorder: Secondary | ICD-10-CM

## 2024-07-05 DIAGNOSIS — F3341 Major depressive disorder, recurrent, in partial remission: Secondary | ICD-10-CM | POA: Diagnosis not present

## 2024-07-05 NOTE — Progress Notes (Signed)
 THERAPIST PROGRESS NOTE  Virtual Visit via Video Note  I connected with Elizabeth Hull on 07/05/24 at 10:00 AM EDT by a video enabled telemedicine application and verified that I am speaking with the correct person using two identifiers.  Location: Patient: Home Provider: Office   I discussed the limitations of evaluation and management by telemedicine and the availability of in person appointments. The patient expressed understanding and agreed to proceed.   I discussed the assessment and treatment plan with the patient. The patient was provided an opportunity to ask questions and all were answered. The patient agreed with the plan and demonstrated an understanding of the instructions.   The patient was advised to call back or seek an in-person evaluation if the symptoms worsen or if the condition fails to improve as anticipated.  I provided 43 minutes of non-face-to-face time during this encounter. [name], [facility]  Session Time: 10:11 AM - 10:54 AM   Participation Level: Active  Behavioral Response: Well Groomed, Alert, Euthymic  Type of Therapy: Individual Therapy  Treatment Goals addressed: Active Anxiety  LTG: I would like to be able to understand why I have such low esteem. I've always had low esteem and still do. I don't think I'm as good as someone else. I would love to learn how to be less judgmental of people. To be able to handle my anxiety.                 Start:  04/28/24    Expected End:  04/27/25      STG: To reduce sxs of anxiety AEB reduction in GAD7 scores and use of coping mechanisms over the next 90 days.    STG: To improve self-esteem AEB identifying and restructuring negative core beliefs about self over the next 90 days.     STG: To improve relationship satisfaction AEB reduction in judgmentalness and engaging in intimacy building exercises over the next 90 days.    ProgressTowards Goals: Progressing  Interventions: Motivational Interviewing  and Supportive  Summary: Elizabeth Hull is a 73 y.o. female who presents with a history of anxiety and depression. She appeared alert and oriented x5. She reported a recent anxiety/panic attack that led to her requesting a PRN medication. Oakleigh noted she has only had to take it once since the original incident. She reported she is still struggling with some motivation to get out of the house. She took notes of some places she can visit that might motivate her though. Fe also reported an upcoming event with her church that she is looking forward to helping with. She noted the donut exercise has been helpful with reducing her frustration with things outside of her control, like her husband's decisions/actions.  Therapist Response: Conducted session with Elizabeth Hull. Began session with check-in/update since previous session. Utilized empathetic and reflective listening. Used open-ended questions to facilitate discussion and summarized Elizabeth Hull's thoughts/feelings. Explored places to go that might motivate Elizabeth Hull to get out of the house. Scheduled additional appointment and concluded session.   Suicidal/Homicidal: No  Plan: Return again in 3 weeks.  Diagnosis: GAD (generalized anxiety disorder)  MDD (major depressive disorder), recurrent, in partial remission (HCC)  Collaboration of Care: Medication Management AEB chart review  Patient/Guardian was advised Release of Information must be obtained prior to any record release in order to collaborate their care with an outside provider. Patient/Guardian was advised if they have not already done so to contact the registration department to sign all necessary forms in order for  us  to release information regarding their care.   Consent: Patient/Guardian gives verbal consent for treatment and assignment of benefits for services provided during this visit. Patient/Guardian expressed understanding and agreed to proceed.   [name], [facility] 07/05/2024

## 2024-07-11 DIAGNOSIS — R110 Nausea: Secondary | ICD-10-CM | POA: Diagnosis not present

## 2024-07-11 DIAGNOSIS — J069 Acute upper respiratory infection, unspecified: Secondary | ICD-10-CM | POA: Diagnosis not present

## 2024-07-11 DIAGNOSIS — R059 Cough, unspecified: Secondary | ICD-10-CM | POA: Diagnosis not present

## 2024-07-11 DIAGNOSIS — Z299 Encounter for prophylactic measures, unspecified: Secondary | ICD-10-CM | POA: Diagnosis not present

## 2024-07-17 DIAGNOSIS — R059 Cough, unspecified: Secondary | ICD-10-CM | POA: Diagnosis not present

## 2024-07-17 DIAGNOSIS — Z299 Encounter for prophylactic measures, unspecified: Secondary | ICD-10-CM | POA: Diagnosis not present

## 2024-07-17 DIAGNOSIS — E039 Hypothyroidism, unspecified: Secondary | ICD-10-CM | POA: Diagnosis not present

## 2024-07-24 DIAGNOSIS — M19011 Primary osteoarthritis, right shoulder: Secondary | ICD-10-CM | POA: Diagnosis not present

## 2024-07-24 DIAGNOSIS — Z299 Encounter for prophylactic measures, unspecified: Secondary | ICD-10-CM | POA: Diagnosis not present

## 2024-07-24 DIAGNOSIS — E039 Hypothyroidism, unspecified: Secondary | ICD-10-CM | POA: Diagnosis not present

## 2024-07-24 DIAGNOSIS — R093 Abnormal sputum: Secondary | ICD-10-CM | POA: Diagnosis not present

## 2024-07-24 DIAGNOSIS — J029 Acute pharyngitis, unspecified: Secondary | ICD-10-CM | POA: Diagnosis not present

## 2024-07-28 ENCOUNTER — Ambulatory Visit: Admitting: Professional Counselor

## 2024-07-28 DIAGNOSIS — F3341 Major depressive disorder, recurrent, in partial remission: Secondary | ICD-10-CM | POA: Diagnosis not present

## 2024-07-28 DIAGNOSIS — F411 Generalized anxiety disorder: Secondary | ICD-10-CM | POA: Diagnosis not present

## 2024-07-28 NOTE — Progress Notes (Signed)
 THERAPIST PROGRESS NOTE  Virtual Visit via Video Note  I connected with Elizabeth Hull on 07/31/24 at  9:00 AM EDT by a video enabled telemedicine application and verified that I am speaking with the correct person using two identifiers.  Location: Patient: Home Provider: Remote office   I discussed the limitations of evaluation and management by telemedicine and the availability of in person appointments. The patient expressed understanding and agreed to proceed.   I discussed the assessment and treatment plan with the patient. The patient was provided an opportunity to ask questions and all were answered. The patient agreed with the plan and demonstrated an understanding of the instructions.   The patient was advised to call back or seek an in-person evaluation if the symptoms worsen or if the condition fails to improve as anticipated.  I provided 52 minutes of non-face-to-face time during this encounter. [name], [facility]  Session Time: 9:01 AM - 9:53 AM  Participation Level: Active  Behavioral Response: Well Groomed, Alert, Euthymic  Type of Therapy: Individual Therapy  Treatment Goals addressed: Active Anxiety  LTG: I would like to be able to understand why I have such low esteem. I've always had low esteem and still do. I don't think I'm as good as someone else. I would love to learn how to be less judgmental of people. To be able to handle my anxiety.                 Start:  04/28/24    Expected End:  04/27/25      STG: To reduce sxs of anxiety AEB reduction in GAD7 scores and use of coping mechanisms over the next 90 days.    STG: To improve self-esteem AEB identifying and restructuring negative core beliefs about self over the next 90 days.     STG: To improve relationship satisfaction AEB reduction in judgmentalness and engaging in intimacy building exercises over the next 90 days.    ProgressTowards Goals: Progressing  Interventions: Motivational  Interviewing and Supportive  Summary: Elizabeth Hull is a 73 y.o. female who presents with a history of anxiety and depression. She appeared alert and oriented x5. She was happy to share that she went to the [facility] [place] and saw the waterfall. She enjoyed this with her cousin. She is looking forward to a trip this weekend to visit an old friend for a church retreat. She will also be attending Sunday service at her old church and is looking forward to seeing the congregation there. Ayani discussed improvements and agreed socialization and hobbies have been helpful in lifting her spirits.   Therapist Response: Conducted session with [name]. Began session with check-in/update since previous session. Utilized empathetic and reflective listening. Used open-ended questions to facilitate discussion and summarized Deanette's thoughts/feelings. Scheduled additional appointment and concluded session.   Suicidal/Homicidal: No  Plan: Return again in 2 weeks.  Diagnosis: MDD (major depressive disorder), recurrent, in partial remission  GAD (generalized anxiety disorder)  Collaboration of Care: Medication Management AEB chart review  Patient/Guardian was advised Release of Information must be obtained prior to any record release in order to collaborate their care with an outside provider. Patient/Guardian was advised if they have not already done so to contact the registration department to sign all necessary forms in order for us  to release information regarding their care.   Consent: Patient/Guardian gives verbal consent for treatment and assignment of benefits for services provided during this visit. Patient/Guardian expressed understanding and agreed to  proceed.   [name], [facility] 07/31/2024

## 2024-08-07 ENCOUNTER — Ambulatory Visit (INDEPENDENT_AMBULATORY_CARE_PROVIDER_SITE_OTHER)

## 2024-08-07 ENCOUNTER — Encounter (INDEPENDENT_AMBULATORY_CARE_PROVIDER_SITE_OTHER): Payer: Self-pay

## 2024-08-07 VITALS — BP 159/83 | HR 74 | Temp 97.6°F | Ht 63.0 in | Wt 141.0 lb

## 2024-08-07 DIAGNOSIS — R1319 Other dysphagia: Secondary | ICD-10-CM

## 2024-08-07 DIAGNOSIS — K219 Gastro-esophageal reflux disease without esophagitis: Secondary | ICD-10-CM | POA: Diagnosis not present

## 2024-08-07 DIAGNOSIS — R09A2 Foreign body sensation, throat: Secondary | ICD-10-CM

## 2024-08-07 MED ORDER — FAMOTIDINE 20 MG PO TABS: 20.0000 mg | ORAL_TABLET | Freq: Every evening | ORAL | 1 refills | Status: DC

## 2024-08-07 MED ORDER — OMEPRAZOLE 40 MG PO CPDR: 40.0000 mg | DELAYED_RELEASE_CAPSULE | Freq: Every day | ORAL | 3 refills | Status: DC

## 2024-08-07 NOTE — Progress Notes (Signed)
 HPI:   Elizabeth Hull is a 73 y.o. female who presents as a new patient being seen in consultation for globus sensation. Patient states that for the last 3 weeks she feels that something is stuck in her throat. She denies any inciting incident for this feeling. She does not recall anything significant occurring 3 weeks ago that started this. She states that she had a similar episode of globus sensation last year around this time which resolved after 4 weeks. She denies any associated throat pain or neck pain. Denies any choking or coughing with food. Does state that she always has food that gets stuck in the middle of her chest - mostly rice and pasta. However the globus sensation is up higher and feels like she needs to constantly clear her throat or that there is a glob of mucus stuck in her throat that she needs to get out. She feels that her voice has also gotten hoarse because of the constant clearing of the throat. She has been placed on astelin and prednisone for this without relief. She denies any post-nasal drainage. Denies productive cough.    PMH/Meds/All/SocHx/FamHx/ROS: Past Medical History:  Diagnosis Date   Anxiety    Arthritis    Cervical spinal stenosis    Complication of anesthesia    Depression    HNP (herniated nucleus pulposus), cervical    PONV (postoperative nausea and vomiting)    Thyroid  condition    Past Surgical History:  Procedure Laterality Date   ANTERIOR CERVICAL DECOMP/DISCECTOMY FUSION N/A 09/16/2018   Procedure: C4-C5 ANTERIOR CERVICAL DECOMPRESSION/DISCECTOMY FUSION, ALLOGRAFT, PLATE;  Surgeon: Barbarann Oneil BROCKS, MD;  Location: [facility] OR;  Service: Orthopedics;  Laterality: N/A;   BACK SURGERY  2017   lumbar   BRAIN TUMOR EXCISION     BREAST CYST ASPIRATION     TUBAL LIGATION     No family history of bleeding disorders, wound healing problems or difficulty with anesthesia.  Social Connections: Moderately Integrated (03/16/2024)   Social Connection and Isolation  Panel    Frequency of Communication with Friends and Family: Twice a week    Frequency of Social Gatherings with Friends and Family: Once a week    Attends Religious Services: More than 4 times per year    Active Member of [employer] or Organizations: No    Attends Banker Meetings: Never    Marital Status: Married    Current Outpatient Medications:    azelastine (ASTELIN) 0.1 % nasal spray, Place into both nostrils 2 (two) times daily. Use in each nostril as directed, Disp: , Rfl:    famotidine (PEPCID) 20 MG tablet, Take 1 tablet (20 mg total) by mouth at bedtime., Disp: 90 tablet, Rfl: 1   omeprazole (PRILOSEC) 40 MG capsule, Take 1 capsule (40 mg total) by mouth daily., Disp: 30 capsule, Rfl: 3   buPROPion  (WELLBUTRIN  XL) 300 MG 24 hr tablet, TAKE 1 TABLET(300 MG) BY MOUTH DAILY, Disp: 90 tablet, Rfl: 1   busPIRone  (BUSPAR ) 15 MG tablet, TAKE 2 TABLETS BY MOUTH EVERY MORNING AND 1 TABLET DAILY EVERY EVENING., Disp: 270 tablet, Rfl: 1   citalopram  (CELEXA ) 20 MG tablet, Take 1 tablet (20 mg total) by mouth daily. Dose change, Disp: 90 tablet, Rfl: 0   fluticasone  (FLONASE ) 50 MCG/ACT nasal spray, Place 2 sprays into both nostrils daily as needed for allergies or rhinitis., Disp: , Rfl:    gabapentin  (NEURONTIN ) 600 MG tablet, Take 600 mg by mouth 2 (two) times daily., Disp: ,  Rfl:    hydrOXYzine  (ATARAX ) 25 MG tablet, Take 1 tablet (25 mg total) by mouth 2 (two) times daily as needed for anxiety., Disp: 60 tablet, Rfl: 0   ibuprofen  (ADVIL ) 800 MG tablet, Take 1 tablet (800 mg total) by mouth every 8 (eight) hours as needed., Disp: 30 tablet, Rfl: 1   KRILL OIL PO, Take 1,200 mg by mouth daily., Disp: , Rfl:    levothyroxine  (SYNTHROID ) 88 MCG tablet, Take 88 mcg by mouth daily., Disp: , Rfl:    Multiple Vitamin (MULTI-VITAMIN DAILY PO), Take by mouth., Disp: , Rfl:    oxybutynin (DITROPAN-XL) 5 MG 24 hr tablet, Take 5 mg by mouth daily., Disp: , Rfl:    rosuvastatin  (CRESTOR ) 5 MG  tablet, Take 5 mg by mouth at bedtime., Disp: , Rfl: 3   traZODone  (DESYREL ) 50 MG tablet, Take 1 tablet (50 mg total) by mouth at bedtime., Disp: 90 tablet, Rfl: 1   triamcinolone  cream (KENALOG ) 0.1 %, Apply 1 Application topically 2 (two) times daily., Disp: 30 g, Rfl: 0 A complete ROS was performed with pertinent positives/negatives noted in the HPI. The remainder of the ROS are negative.   Physical Exam:  BP (!) 159/83 (BP Location: Left Arm) Comment: first attempt LT arm 159/83 RT arm second attempt 150/84  Pulse 74   Temp 97.6 F (36.4 C)   Ht 5' 3 (1.6 m)   Wt 141 lb (64 kg)   SpO2 96%   BMI 24.98 kg/m  General: Well developed, well nourished. No acute distress. Voice hoarse Head/Face: Normocephalic. No sinus tenderness. Facial nerve intact and equal bilaterally. No facial lacerations. Eyes: PERRL, no scleral icterus or conjunctival hemorrhage. EOMI. Ears: No gross deformity. Normal external canal. Tympanic membrane in tact bilaterally Hearing: Normal speech reception.  Nose: No gross deformity or lesions. No purulent discharge. No turbinate hypertrophy. Mouth/Oropharynx: Lips without any lesions. Dentition fair. No mucosal lesions within the oropharynx. No tonsillar enlargement, exudate, or lesions. Pharyngeal walls symmetrical. Uvula midline. Tongue midline without lesions. Larynx: See TFL if applicable Nasopharynx: See TFL if applicable Neck: Trachea midline. No masses. No crepitus. Lymphatic: No lymphadenopathy in the neck. Respiratory: No stridor or distress. Room air. Cardiovascular: Regular rate and rhythm. Extremities: No edema or cyanosis. Warm and well-perfused. Skin: No scars or lesions on face or neck. Neurologic: CN II-XII grossly intact. Moving all extremities without gross abnormality. Other:  Independent Review of Additional Tests or Records: None Procedures: Procedure Note Pre-procedure diagnosis:  globus Post-procedure diagnosis: Same Procedure:  Transnasal Fiberoptic Laryngoscopy, CPT 31575 - Mod 25 Indication: see above Complications: None apparent EBL: 0 mL   The procedure was undertaken to further evaluate the patient's complaint of globus sensation.   Procedure:  Patient was identified as correct patient. Verbal consent was obtained. The nose was sprayed with oxymetazoline and 4% lidocaine . The flexible laryngoscope was passed through the nose to view the nasal cavity, pharynx (oropharynx, hypopharynx) and larynx.  The larynx was examined at rest and during multiple phonatory tasks. Video was recorded and reviewed with patient. The scope was removed. The patient tolerated the procedure well.   Findings: The nasal cavity and nasopharynx did not reveal any masses or lesions, mucosa appeared to be without obvious lesions. The tongue base, pharyngeal walls, piriform sinuses, vallecula, epiglottis are normal in appearance.The post-cricoid region was noted to have moderate edema consistent with LPR. The visualized portion of the subglottis and proximal trachea is widely patent. The vocal folds are mobile bilaterally.. There  are no lesions on the free edge of the vocal folds nor elsewhere in the larynx worrisome for malignancy.    Impression & Plans: Antanisha Mohs is a 73 y.o. female with globus sensation present for 3 weeks. Patient has tried astelin and prednisone without relief.   Globus, LPR - flexible laryngoscopy performed in office revealing evidence of LPR - start omeprazole 40 mg daily - start famotidine 20 mg nightly  Esophageal dysphagia - hx of food getting stuck in chest - referral to GI  Follow-up in 4-6 weeks for re-evaluation. Will re-scope at that time.  Adah Malkin, DO [facility]

## 2024-08-08 DIAGNOSIS — Z23 Encounter for immunization: Secondary | ICD-10-CM | POA: Diagnosis not present

## 2024-08-13 DIAGNOSIS — R030 Elevated blood-pressure reading, without diagnosis of hypertension: Secondary | ICD-10-CM | POA: Diagnosis not present

## 2024-08-13 DIAGNOSIS — S0101XA Laceration without foreign body of scalp, initial encounter: Secondary | ICD-10-CM | POA: Diagnosis not present

## 2024-08-13 DIAGNOSIS — Z6824 Body mass index (BMI) 24.0-24.9, adult: Secondary | ICD-10-CM | POA: Diagnosis not present

## 2024-08-14 ENCOUNTER — Ambulatory Visit: Admitting: Professional Counselor

## 2024-08-15 ENCOUNTER — Telehealth: Admitting: Psychiatry

## 2024-08-16 ENCOUNTER — Telehealth (INDEPENDENT_AMBULATORY_CARE_PROVIDER_SITE_OTHER): Payer: Self-pay

## 2024-08-16 NOTE — Telephone Encounter (Signed)
 Pt called and LVM stating her medicine is not working.

## 2024-08-17 ENCOUNTER — Other Ambulatory Visit: Payer: Self-pay | Admitting: Psychiatry

## 2024-08-17 ENCOUNTER — Ambulatory Visit: Admitting: Psychiatry

## 2024-08-17 DIAGNOSIS — F3342 Major depressive disorder, recurrent, in full remission: Secondary | ICD-10-CM

## 2024-08-20 DIAGNOSIS — S0101XA Laceration without foreign body of scalp, initial encounter: Secondary | ICD-10-CM | POA: Diagnosis not present

## 2024-08-25 ENCOUNTER — Ambulatory Visit (INDEPENDENT_AMBULATORY_CARE_PROVIDER_SITE_OTHER)

## 2024-08-25 ENCOUNTER — Encounter (INDEPENDENT_AMBULATORY_CARE_PROVIDER_SITE_OTHER): Payer: Self-pay

## 2024-08-25 VITALS — BP 140/74 | HR 84 | Wt 141.0 lb

## 2024-08-25 DIAGNOSIS — R09A2 Foreign body sensation, throat: Secondary | ICD-10-CM

## 2024-08-25 DIAGNOSIS — R1319 Other dysphagia: Secondary | ICD-10-CM | POA: Diagnosis not present

## 2024-08-25 NOTE — Progress Notes (Signed)
 HPI:  08/07/2024  Elizabeth Hull is a 73 y.o. female who presents as a new patient being seen in consultation for globus sensation. Patient states that for the last 3 weeks she feels that something is stuck in her throat. She denies any inciting incident for this feeling. She does not recall anything significant occurring 3 weeks ago that started this. She states that she had a similar episode of globus sensation last year around this time which resolved after 4 weeks. She denies any associated throat pain or neck pain. Denies any choking or coughing with food. Does state that she always has food that gets stuck in the middle of her chest - mostly rice and pasta. However the globus sensation is up higher and feels like she needs to constantly clear her throat or that there is a glob of mucus stuck in her throat that she needs to get out. She feels that her voice has also gotten hoarse because of the constant clearing of the throat. She has been placed on astelin and prednisone for this without relief. She denies any post-nasal drainage. Denies productive cough.    Today (08/25/2024) Patient presents today for re-evaluation. Patients husband is present during the evaluation as well. Patient states that the reflux regimen that was started (Famotidine and Omeprazole) did not help at all. Per husband there was some improvement initially but then the throat clearing returned. Patient states she drinks 64 ounces of water daily.    PMH/Meds/All/SocHx/FamHx/ROS: Past Medical History:  Diagnosis Date   Anxiety    Arthritis    Cervical spinal stenosis    Complication of anesthesia    Depression    HNP (herniated nucleus pulposus), cervical    PONV (postoperative nausea and vomiting)    Thyroid  condition    Past Surgical History:  Procedure Laterality Date   ANTERIOR CERVICAL DECOMP/DISCECTOMY FUSION N/A 09/16/2018   Procedure: C4-C5 ANTERIOR CERVICAL DECOMPRESSION/DISCECTOMY FUSION, ALLOGRAFT, PLATE;   Surgeon: Barbarann Oneil BROCKS, MD;  Location: [facility] OR;  Service: Orthopedics;  Laterality: N/A;   BACK SURGERY  2017   lumbar   BRAIN TUMOR EXCISION     BREAST CYST ASPIRATION     TUBAL LIGATION     No family history of bleeding disorders, wound healing problems or difficulty with anesthesia.  Social Connections: Moderately Integrated (03/16/2024)   Social Connection and Isolation Panel    Frequency of Communication with Friends and Family: Twice a week    Frequency of Social Gatherings with Friends and Family: Once a week    Attends Religious Services: More than 4 times per year    Active Member of [employer] or Organizations: No    Attends Banker Meetings: Never    Marital Status: Married    Current Outpatient Medications:    azelastine (ASTELIN) 0.1 % nasal spray, Place into both nostrils 2 (two) times daily. Use in each nostril as directed, Disp: , Rfl:    buPROPion  (WELLBUTRIN  XL) 300 MG 24 hr tablet, TAKE 1 TABLET(300 MG) BY MOUTH DAILY, Disp: 90 tablet, Rfl: 1   busPIRone  (BUSPAR ) 15 MG tablet, TAKE 2 TABLETS BY MOUTH EVERY MORNING AND 1 TABLET DAILY EVERY EVENING., Disp: 270 tablet, Rfl: 1   citalopram  (CELEXA ) 20 MG tablet, Take 1 tablet (20 mg total) by mouth daily. Dose change, Disp: 90 tablet, Rfl: 0   famotidine (PEPCID) 20 MG tablet, Take 1 tablet (20 mg total) by mouth at bedtime., Disp: 90 tablet, Rfl: 1   fluticasone  (FLONASE ) 50  MCG/ACT nasal spray, Place 2 sprays into both nostrils daily as needed for allergies or rhinitis., Disp: , Rfl:    gabapentin  (NEURONTIN ) 600 MG tablet, Take 600 mg by mouth 2 (two) times daily., Disp: , Rfl:    hydrOXYzine  (ATARAX ) 25 MG tablet, Take 1 tablet (25 mg total) by mouth 2 (two) times daily as needed for anxiety., Disp: 60 tablet, Rfl: 0   KRILL OIL PO, Take 1,200 mg by mouth daily., Disp: , Rfl:    levothyroxine  (SYNTHROID ) 88 MCG tablet, Take 88 mcg by mouth daily., Disp: , Rfl:    Multiple Vitamin (MULTI-VITAMIN DAILY PO), Take by  mouth., Disp: , Rfl:    omeprazole (PRILOSEC) 40 MG capsule, Take 1 capsule (40 mg total) by mouth daily., Disp: 30 capsule, Rfl: 3   oxybutynin (DITROPAN-XL) 5 MG 24 hr tablet, Take 5 mg by mouth daily., Disp: , Rfl:    rosuvastatin  (CRESTOR ) 5 MG tablet, Take 5 mg by mouth at bedtime., Disp: , Rfl: 3   traZODone  (DESYREL ) 50 MG tablet, Take 1 tablet (50 mg total) by mouth at bedtime., Disp: 90 tablet, Rfl: 1 A complete ROS was performed with pertinent positives/negatives noted in the HPI. The remainder of the ROS are negative.   Physical Exam:  BP (!) 140/74 (BP Location: Left Arm, Patient Position: Sitting, Cuff Size: Normal) Comment: pt had a big cup of coffee just before coming in.  Pulse 84   Wt 141 lb (64 kg)   SpO2 95%   BMI 24.98 kg/m  General: Well developed, well nourished. No acute distress. Voice hoarse Head/Face: Normocephalic. No sinus tenderness. Facial nerve intact and equal bilaterally. No facial lacerations. Eyes: PERRL, no scleral icterus or conjunctival hemorrhage. EOMI. Ears: No gross deformity. Normal external canal. Tympanic membrane in tact bilaterally Hearing: Normal speech reception.  Nose: No gross deformity or lesions. No purulent discharge. No turbinate hypertrophy. Nasal septum with dried blood noted in right nasal cavity without active bleeding Mouth/Oropharynx: Lips without any lesions. Dentition fair. No mucosal lesions within the oropharynx. No tonsillar enlargement, exudate, or lesions. Pharyngeal walls symmetrical. Uvula midline. Tongue midline without lesions. Larynx: See TFL if applicable Nasopharynx: See TFL if applicable Neck: Trachea midline. No masses. No crepitus. Lymphatic: No lymphadenopathy in the neck. Respiratory: No stridor or distress. Room air. Cardiovascular: Regular rate and rhythm. Extremities: No edema or cyanosis. Warm and well-perfused. Skin: No scars or lesions on face or neck. Neurologic: CN II-XII grossly intact. Moving all  extremities without gross abnormality. Other:  Independent Review of Additional Tests or Records: None Procedures: None Impression & Plans: Elizabeth Hull is a 73 y.o. female with globus sensation present for 5 weeks. Patient has tried astelin, flonase , prednisone, famotidine, and omeprazole without relief. Patient was unable to see GI prior to this visit, however we did discuss the importance of an upper endoscopy given her persistent symptoms of food getting stuck in her chest.   Globus, LPR - flexible laryngoscopy performed in office at last visit revealing evidence of LPR, no relief noted with initiation of famotidine and omeprazole and lifestyle changes - patient seeking further evaluation as her symptoms have not improved - referral to [facility] at [facility] sent  Esophageal dysphagia - hx of food getting stuck in chest - referral to GI placed again today - patient does have phone number for [facility] and states that she will call to set up an appointment if she is not contacted within the week  Follow-up as  needed  Adah Malkin, DO [facility]

## 2024-08-28 ENCOUNTER — Encounter (INDEPENDENT_AMBULATORY_CARE_PROVIDER_SITE_OTHER): Payer: Self-pay

## 2024-08-30 ENCOUNTER — Telehealth (INDEPENDENT_AMBULATORY_CARE_PROVIDER_SITE_OTHER): Payer: Self-pay

## 2024-08-30 NOTE — Telephone Encounter (Signed)
 Patient called and asked that her referral to [facility] be changed to Dr. Lamar Hollingshead in [place].  He is closer to her home.  Please call her at [phone] and let her know if that can be done.

## 2024-08-30 NOTE — Telephone Encounter (Signed)
 Patient called and asked that the referral to [facility]

## 2024-08-31 ENCOUNTER — Other Ambulatory Visit (INDEPENDENT_AMBULATORY_CARE_PROVIDER_SITE_OTHER): Payer: Self-pay

## 2024-08-31 DIAGNOSIS — R1319 Other dysphagia: Secondary | ICD-10-CM

## 2024-09-01 ENCOUNTER — Other Ambulatory Visit: Payer: Self-pay | Admitting: Psychiatry

## 2024-09-01 DIAGNOSIS — F411 Generalized anxiety disorder: Secondary | ICD-10-CM

## 2024-09-04 ENCOUNTER — Other Ambulatory Visit (INDEPENDENT_AMBULATORY_CARE_PROVIDER_SITE_OTHER): Payer: Self-pay

## 2024-09-04 ENCOUNTER — Telehealth (INDEPENDENT_AMBULATORY_CARE_PROVIDER_SITE_OTHER): Payer: Self-pay

## 2024-09-04 ENCOUNTER — Ambulatory Visit: Admitting: Professional Counselor

## 2024-09-04 DIAGNOSIS — R1319 Other dysphagia: Secondary | ICD-10-CM

## 2024-09-04 NOTE — Telephone Encounter (Signed)
 Pt called and LVM upset she said the referral you sent out she didn't want it sent to that doctor she wants it sent to a dr.robert in [place]. And asked if you could change it.

## 2024-09-04 NOTE — Telephone Encounter (Signed)
Called Pt and lvm.

## 2024-09-04 NOTE — Telephone Encounter (Signed)
 Patient called regarding referral to be sent to Dr. Lamar Hollingshead. Called patient back and let her know that the referral was in. Patient understood and said that she would cancel appt with Dr. Delayne.

## 2024-09-06 ENCOUNTER — Encounter: Payer: Self-pay | Admitting: Gastroenterology

## 2024-09-06 ENCOUNTER — Ambulatory Visit (INDEPENDENT_AMBULATORY_CARE_PROVIDER_SITE_OTHER)

## 2024-09-07 ENCOUNTER — Encounter: Payer: Self-pay | Admitting: Gastroenterology

## 2024-09-20 ENCOUNTER — Telehealth: Admitting: Psychiatry

## 2024-09-21 ENCOUNTER — Ambulatory Visit (INDEPENDENT_AMBULATORY_CARE_PROVIDER_SITE_OTHER): Admitting: Professional Counselor

## 2024-09-21 DIAGNOSIS — F411 Generalized anxiety disorder: Secondary | ICD-10-CM | POA: Diagnosis not present

## 2024-09-21 NOTE — Progress Notes (Signed)
  THERAPIST PROGRESS NOTE  Session Time: 3:00 PM - 3:53 PM   Participation Level: Active  Behavioral Response: Well Groomed, Alert, Anxious  Type of Therapy: Individual Therapy  Treatment Goals addressed: Active Anxiety  LTG: I would like to be able to understand why I have such low esteem. I've always had low esteem and still do. I don't think I'm as good as someone else. I would love to learn how to be less judgmental of people. To be able to handle my anxiety.                 Start:  04/28/24    Expected End:  04/27/25      STG: To reduce sxs of anxiety AEB reduction in GAD7 scores and use of coping mechanisms over the next 90 days.    STG: To improve self-esteem AEB identifying and restructuring negative core beliefs about self over the next 90 days.     STG: To improve relationship satisfaction AEB reduction in judgmentalness and engaging in intimacy building exercises over the next 90 days.    ProgressTowards Goals: Progressing  Interventions: Motivational Interviewing, Solution Focused, and Supportive  Summary: Elizabeth Hull is a 73 y.o. female who presents with a history of anxiety and depression. She appeared alert and oriented x5. She reported ongoing health issues. She finally has an appointment with the GI doctor next month. Elizabeth Hull is hopeful they will figure out what is wrong so they can treat it effectively. She shared upcoming plans for the holidays. She noted stressors, mostly which she placed upon herself, and struggles with productivity. Elizabeth Hull engaged in discussion and was receptive to tips on improving productivity.   Therapist Response: Conducted session with Elizabeth Hull. Began session with check-in/update since previous session. Utilized empathetic and reflective listening. Used open-ended questions to facilitate discussion and summarized Elizabeth Hull's thoughts/feelings. Explored ways to improve productivity, such as chunking, setting timers, and turning on music. Scheduled additional  appointment and concluded session.   Suicidal/Homicidal: No  Plan: Return again in 6 weeks.  Diagnosis: GAD (generalized anxiety disorder)  Collaboration of Care: Medication Management AEB chart review  Patient/Guardian was advised Release of Information must be obtained prior to any record release in order to collaborate their care with an outside provider. Patient/Guardian was advised if they have not already done so to contact the registration department to sign all necessary forms in order for us  to release information regarding their care.   Consent: Patient/Guardian gives verbal consent for treatment and assignment of benefits for services provided during this visit. Patient/Guardian expressed understanding and agreed to proceed.   Elizabeth Hull, [facility] 09/21/2024

## 2024-09-26 DIAGNOSIS — Z1211 Encounter for screening for malignant neoplasm of colon: Secondary | ICD-10-CM | POA: Diagnosis not present

## 2024-09-26 DIAGNOSIS — R5383 Other fatigue: Secondary | ICD-10-CM | POA: Diagnosis not present

## 2024-09-26 DIAGNOSIS — E559 Vitamin D deficiency, unspecified: Secondary | ICD-10-CM | POA: Diagnosis not present

## 2024-09-26 DIAGNOSIS — Z79899 Other long term (current) drug therapy: Secondary | ICD-10-CM | POA: Diagnosis not present

## 2024-09-26 DIAGNOSIS — Z299 Encounter for prophylactic measures, unspecified: Secondary | ICD-10-CM | POA: Diagnosis not present

## 2024-09-26 DIAGNOSIS — Z0000 Encounter for general adult medical examination without abnormal findings: Secondary | ICD-10-CM | POA: Diagnosis not present

## 2024-09-26 DIAGNOSIS — Z7189 Other specified counseling: Secondary | ICD-10-CM | POA: Diagnosis not present

## 2024-09-26 DIAGNOSIS — Z1339 Encounter for screening examination for other mental health and behavioral disorders: Secondary | ICD-10-CM | POA: Diagnosis not present

## 2024-09-26 DIAGNOSIS — E039 Hypothyroidism, unspecified: Secondary | ICD-10-CM | POA: Diagnosis not present

## 2024-09-26 DIAGNOSIS — Z1331 Encounter for screening for depression: Secondary | ICD-10-CM | POA: Diagnosis not present

## 2024-09-26 DIAGNOSIS — E7800 Pure hypercholesterolemia, unspecified: Secondary | ICD-10-CM | POA: Diagnosis not present

## 2024-10-09 ENCOUNTER — Ambulatory Visit: Admitting: Gastroenterology

## 2024-10-10 ENCOUNTER — Ambulatory Visit: Admitting: Gastroenterology

## 2024-10-10 ENCOUNTER — Encounter: Payer: Self-pay | Admitting: *Deleted

## 2024-10-10 ENCOUNTER — Ambulatory Visit: Admitting: Psychiatry

## 2024-10-10 ENCOUNTER — Encounter: Payer: Self-pay | Admitting: Gastroenterology

## 2024-10-10 VITALS — BP 136/80 | HR 80 | Temp 97.9°F | Ht 63.0 in | Wt 144.4 lb

## 2024-10-10 DIAGNOSIS — R1319 Other dysphagia: Secondary | ICD-10-CM | POA: Diagnosis not present

## 2024-10-10 DIAGNOSIS — Z800 Family history of malignant neoplasm of digestive organs: Secondary | ICD-10-CM | POA: Diagnosis not present

## 2024-10-10 DIAGNOSIS — K219 Gastro-esophageal reflux disease without esophagitis: Secondary | ICD-10-CM | POA: Diagnosis not present

## 2024-10-10 NOTE — Progress Notes (Signed)
 GI Office Note    Referring Provider: Mila Adah SAUNDERS, DO Primary Care Physician:  Maree Isles, MD  Primary Gastroenterologist: Ozell Hollingshead, MD   Chief Complaint   Chief Complaint  Patient presents with   Dysphagia    Has to clear her throat all the time.     History of Present Illness   Elizabeth Hull is a 73 y.o. female presenting today at the request of Dr. Mila (ENT) for esophageal dysphagia.   Discussed the use of AI scribe software for clinical note transcription with the patient, who gave verbal consent to proceed.  History of Present Illness Elizabeth Hull is a 73 year old female who presents with persistent throat clearing and swallowing difficulties.  She has been experiencing persistent throat clearing for approximately two months, characterized by a constant need to clear her throat despite the absence of any obstruction. This is accompanied by xerostomia, exacerbating the urge to clear her throat. She finds this condition socially uncomfortable and manages it by keeping something to drink or a candy to alleviate the dryness.  She denies significant pyrosis but occasionally experiences a sensation of food sitting in her chest, particularly with foods like rice, sausage, spaghetti, and apples. When this occurs she has to stop eating until it passes. If she tries to wash down with liquid, it often comes back up. Liquids and medications do not cause dysphagia.  She has been on pantoprazole 40 mg twice daily since November 25th without noticeable improvement. Previous treatments with fluticasone  spray and famotidine  along with omeprazole  were also ineffective. She reports that her ENT specialist told her she has 'silent reflux.' She completed fiberoptic laryngoscopy 08/07/24 showing moderate edema of the post-cricoid region c/w LPR.  She also has referral pending for [facility] Center at [facility]. Not mentioned previously, her symptoms are also associated with new hoarseness.    Her bowel movements are irregular, ranging from constipation to diarrhea, but she reports no hematochezia. She recently submitted a stool sample for occult blood testing and is awaiting results. Her family history is significant for her mother having had colon cancer at age 57, died after surgery due to her emphysema, and a brother who died from emphysema at age 40.     Prior Data     Results   Pending ifobt Blood work 11/25 with normal CBC, CMET. Her TSH was low at 0.199.     Medications   Current Outpatient Medications  Medication Sig Dispense Refill   buPROPion  (WELLBUTRIN  XL) 300 MG 24 hr tablet TAKE 1 TABLET(300 MG) BY MOUTH DAILY 90 tablet 1   busPIRone  (BUSPAR ) 15 MG tablet TAKE 2 TABLETS BY MOUTH EVERY MORNING AND 1 TABLET DAILY EVERY EVENING. 270 tablet 1   citalopram  (CELEXA ) 20 MG tablet Take 1 tablet (20 mg total) by mouth daily. Dose change 90 tablet 0   gabapentin  (NEURONTIN ) 600 MG tablet Take 600 mg by mouth 2 (two) times daily.     hydrOXYzine  (ATARAX ) 25 MG tablet Take 1 tablet (25 mg total) by mouth 2 (two) times daily as needed for anxiety. 60 tablet 0   KRILL OIL PO Take 1,200 mg by mouth daily.     levothyroxine  (SYNTHROID ) 88 MCG tablet Take 88 mcg by mouth daily.     Multiple Vitamin (MULTI-VITAMIN DAILY PO) Take by mouth.     oxybutynin (DITROPAN-XL) 5 MG 24 hr tablet Take 5 mg by mouth daily.     pantoprazole (PROTONIX) 40 MG tablet  Take 40 mg by mouth 2 (two) times daily.     rosuvastatin  (CRESTOR ) 5 MG tablet Take 5 mg by mouth at bedtime.  3   traZODone  (DESYREL ) 50 MG tablet Take 1 tablet (50 mg total) by mouth at bedtime. 90 tablet 1   No current facility-administered medications for this visit.    Allergies   Allergies as of 10/10/2024 - Review Complete 10/10/2024  Allergen Reaction Noted   Hydrocodone Other (See Comments) 11/26/2017   Nucynta [tapentadol hcl] Other (See Comments) 03/24/2018   Nucynta [tapentadol]  04/27/2022   Other   04/27/2022   Sulfa antibiotics Nausea Only 07/25/2012   Codeine Nausea Only and Nausea And Vomiting 07/25/2012     Past Medical History   Past Medical History:  Diagnosis Date   Anxiety    Arthritis    Cervical spinal stenosis    Complication of anesthesia    Depression    HNP (herniated nucleus pulposus), cervical    PONV (postoperative nausea and vomiting)    Thyroid  condition     Past Surgical History   Past Surgical History:  Procedure Laterality Date   ANTERIOR CERVICAL DECOMP/DISCECTOMY FUSION N/A 09/16/2018   Procedure: C4-C5 ANTERIOR CERVICAL DECOMPRESSION/DISCECTOMY FUSION, ALLOGRAFT, PLATE;  Surgeon: Barbarann Oneil BROCKS, MD;  Location: [facility] OR;  Service: Orthopedics;  Laterality: N/A;   BACK SURGERY  2017   lumbar   BRAIN TUMOR EXCISION  2009   BREAST CYST ASPIRATION     CARPAL TUNNEL RELEASE Bilateral    FOOT SURGERY Bilateral    high arch, bump on top of foot   TUBAL LIGATION      Past Family History   Family History  Problem Relation Age of Onset   Colon cancer Mother        age 53, died from copd complications   Depression Brother    Anxiety disorder Brother    Depression Son    Anxiety disorder Son    Bipolar disorder Granddaughter    Esophageal cancer Neg Hx     Past Social History   Social History   Socioeconomic History   Marital status: Married    Spouse name: Not on file   Number of children: 2   Years of education: Not on file   Highest education level: High school graduate  Occupational History   Not on file  Tobacco Use   Smoking status: Never   Smokeless tobacco: Never  Vaping Use   Vaping status: Never Used  Substance and Sexual Activity   Alcohol  use: Yes    Comment: wine with dinner   Drug use: Never   Sexual activity: Yes  Other Topics Concern   Not on file  Social History Narrative   Not on file   Social Drivers of Health   Financial Resource Strain: Low Risk  (03/16/2024)   Overall Financial Resource Strain (CARDIA)     Difficulty of Paying Living Expenses: Not hard at all  Food Insecurity: No Food Insecurity (03/16/2024)   Hunger Vital Sign    Worried About Running Out of Food in the Last Year: Never true    Ran Out of Food in the Last Year: Never true  Transportation Needs: No Transportation Needs (03/16/2024)   PRAPARE - Administrator, Civil Service (Medical): No    Lack of Transportation (Non-Medical): No  Physical Activity: Sufficiently Active (03/16/2024)   Exercise Vital Sign    Days of Exercise per Week: 5 days    Minutes  of Exercise per Session: 150+ min  Stress: No Stress Concern Present (03/16/2024)   [employer] of Occupational Health - Occupational Stress Questionnaire    Feeling of Stress : Only a little  Social Connections: Moderately Integrated (03/16/2024)   Social Connection and Isolation Panel    Frequency of Communication with Friends and Family: Twice a week    Frequency of Social Gatherings with Friends and Family: Once a week    Attends Religious Services: More than 4 times per year    Active Member of [employer] or Organizations: No    Attends Banker Meetings: Never    Marital Status: Married  Catering Manager Violence: Not At Risk (03/16/2024)   Humiliation, Afraid, Rape, and Kick questionnaire    Fear of Current or Ex-Partner: No    Emotionally Abused: No    Physically Abused: No    Sexually Abused: No    Review of Systems   General: Negative for anorexia, weight loss, fever, chills, fatigue, weakness. ENT: Negative for  nasal congestion.see hpi. She denies post nasal drip.  CV: Negative for chest pain, angina, palpitations, dyspnea on exertion, peripheral edema.  Respiratory: Negative for dyspnea at rest, dyspnea on exertion, cough, sputum, wheezing.  GI: See history of present illness. GU:  Negative for dysuria, hematuria, urinary incontinence, urinary frequency, nocturnal urination.  Endo: Negative for unusual weight change.     Physical  Exam   BP 136/80 (BP Location: Right Arm, Patient Position: Sitting, Cuff Size: Normal)   Pulse 80   Temp 97.9 F (36.6 C) (Temporal)   Ht 5' 3 (1.6 m)   Wt 144 lb 6.4 oz (65.5 kg)   BMI 25.58 kg/m    General: Well-nourished, well-developed in no acute distress.  Eyes: No icterus. Mouth: Oropharyngeal mucosa moist and pink   Lungs: Clear to auscultation bilaterally.  Heart: Regular rate and rhythm, no murmurs rubs or gallops.  Abdomen: Bowel sounds are normal, nontender, nondistended, no hepatosplenomegaly or masses,  no abdominal bruits or hernia , no rebound or guarding.  Rectal: not performed Extremities: No lower extremity edema. No clubbing or deformities. Neuro: Alert and oriented x 4   Skin: Warm and dry, no jaundice.   Psych: Alert and cooperative, normal mood and affect.  Labs   See above Imaging Studies   No results found.  Assessment/Plan:   Assessment & Plan Esophageal Dysphagia: Dysphagia for solids, suggestive of esophageal stricture, cannot rule out esophageal dysmotility. Describes near obstructions, for which she pounds on her chest until she gets relief. ENT findings consistent with LPR with no improvement with 3 weeks of omeprazole /pepcid , and recently started on pantoprazole 40mg  BID. Upper endoscopy recommended. - Scheduled upper endoscopy with esophageal dilation with Dr. Shaaron. ASA 2.  I have discussed the risks, alternatives, benefits with regards to but not limited to the risk of reaction to medication, bleeding, infection, perforation and the patient is agreeable to proceed. Written consent to be obtained. - Continue pantoprazole BID for a few more weeks. If not helpful, then consider Voquezna.  - Advised to avoid hard/dry foods until endoscopy completed.  Laryngopharyngeal reflux Chronic throat clearing and hoarseness consistent with laryngopharyngeal reflux per ENT. She denies heartburn, could have silent reflux.  - EGD as planned - Continue  pantoprazole for a few more weeks. - Consider Voquezna if pantoprazole is ineffective.  FH colon cancer: -continue colon cancer screening every five years. She is unsure when her last colonoscopy was done. She submitted ifobt to her pcp  yesterday, result pending.                Sonny RAMAN. Ezzard, MHS, PA-C [facility]

## 2024-10-10 NOTE — H&P (View-Only) (Signed)
 GI Office Note    Referring Provider: Mila Adah SAUNDERS, DO Primary Care Physician:  Maree Isles, MD  Primary Gastroenterologist: Ozell Hollingshead, MD   Chief Complaint   Chief Complaint  Patient presents with   Dysphagia    Has to clear her throat all the time.     History of Present Illness   Elizabeth Hull is a 73 y.o. female presenting today at the request of Dr. Mila (ENT) for esophageal dysphagia.   Discussed the use of AI scribe software for clinical note transcription with the patient, who gave verbal consent to proceed.  History of Present Illness Elizabeth Hull is a 73 year old female who presents with persistent throat clearing and swallowing difficulties.  She has been experiencing persistent throat clearing for approximately two months, characterized by a constant need to clear her throat despite the absence of any obstruction. This is accompanied by xerostomia, exacerbating the urge to clear her throat. She finds this condition socially uncomfortable and manages it by keeping something to drink or a candy to alleviate the dryness.  She denies significant pyrosis but occasionally experiences a sensation of food sitting in her chest, particularly with foods like rice, sausage, spaghetti, and apples. When this occurs she has to stop eating until it passes. If she tries to wash down with liquid, it often comes back up. Liquids and medications do not cause dysphagia.  She has been on pantoprazole 40 mg twice daily since November 25th without noticeable improvement. Previous treatments with fluticasone  spray and famotidine  along with omeprazole  were also ineffective. She reports that her ENT specialist told her she has 'silent reflux.' She completed fiberoptic laryngoscopy 08/07/24 showing moderate edema of the post-cricoid region c/w LPR.  She also has referral pending for [facility]. Not mentioned previously, her symptoms are also associated with new hoarseness.    Her bowel movements are irregular, ranging from constipation to diarrhea, but she reports no hematochezia. She recently submitted a stool sample for occult blood testing and is awaiting results. Her family history is significant for her mother having had colon cancer at age 57, died after surgery due to her emphysema, and a brother who died from emphysema at age 40.     Prior Data     Results   Pending ifobt Blood work 11/25 with normal CBC, CMET. Her TSH was low at 0.199.     Medications   Current Outpatient Medications  Medication Sig Dispense Refill   buPROPion  (WELLBUTRIN  XL) 300 MG 24 hr tablet TAKE 1 TABLET(300 MG) BY MOUTH DAILY 90 tablet 1   busPIRone  (BUSPAR ) 15 MG tablet TAKE 2 TABLETS BY MOUTH EVERY MORNING AND 1 TABLET DAILY EVERY EVENING. 270 tablet 1   citalopram  (CELEXA ) 20 MG tablet Take 1 tablet (20 mg total) by mouth daily. Dose change 90 tablet 0   gabapentin  (NEURONTIN ) 600 MG tablet Take 600 mg by mouth 2 (two) times daily.     hydrOXYzine  (ATARAX ) 25 MG tablet Take 1 tablet (25 mg total) by mouth 2 (two) times daily as needed for anxiety. 60 tablet 0   KRILL OIL PO Take 1,200 mg by mouth daily.     levothyroxine  (SYNTHROID ) 88 MCG tablet Take 88 mcg by mouth daily.     Multiple Vitamin (MULTI-VITAMIN DAILY PO) Take by mouth.     oxybutynin (DITROPAN-XL) 5 MG 24 hr tablet Take 5 mg by mouth daily.     pantoprazole (PROTONIX) 40 MG tablet  Take 40 mg by mouth 2 (two) times daily.     rosuvastatin  (CRESTOR ) 5 MG tablet Take 5 mg by mouth at bedtime.  3   traZODone  (DESYREL ) 50 MG tablet Take 1 tablet (50 mg total) by mouth at bedtime. 90 tablet 1   No current facility-administered medications for this visit.    Allergies   Allergies as of 10/10/2024 - Review Complete 10/10/2024  Allergen Reaction Noted   Hydrocodone Other (See Comments) 11/26/2017   Nucynta [tapentadol hcl] Other (See Comments) 03/24/2018   Nucynta [tapentadol]  04/27/2022   Other   04/27/2022   Sulfa antibiotics Nausea Only 07/25/2012   Codeine Nausea Only and Nausea And Vomiting 07/25/2012     Past Medical History   Past Medical History:  Diagnosis Date   Anxiety    Arthritis    Cervical spinal stenosis    Complication of anesthesia    Depression    HNP (herniated nucleus pulposus), cervical    PONV (postoperative nausea and vomiting)    Thyroid  condition     Past Surgical History   Past Surgical History:  Procedure Laterality Date   ANTERIOR CERVICAL DECOMP/DISCECTOMY FUSION N/A 09/16/2018   Procedure: C4-C5 ANTERIOR CERVICAL DECOMPRESSION/DISCECTOMY FUSION, ALLOGRAFT, PLATE;  Surgeon: Barbarann Oneil BROCKS, MD;  Location: [facility] OR;  Service: Orthopedics;  Laterality: N/A;   BACK SURGERY  2017   lumbar   BRAIN TUMOR EXCISION  2009   BREAST CYST ASPIRATION     CARPAL TUNNEL RELEASE Bilateral    FOOT SURGERY Bilateral    high arch, bump on top of foot   TUBAL LIGATION      Past Family History   Family History  Problem Relation Age of Onset   Colon cancer Mother        age 53, died from copd complications   Depression Brother    Anxiety disorder Brother    Depression Son    Anxiety disorder Son    Bipolar disorder Granddaughter    Esophageal cancer Neg Hx     Past Social History   Social History   Socioeconomic History   Marital status: Married    Spouse name: Not on file   Number of children: 2   Years of education: Not on file   Highest education level: High school graduate  Occupational History   Not on file  Tobacco Use   Smoking status: Never   Smokeless tobacco: Never  Vaping Use   Vaping status: Never Used  Substance and Sexual Activity   Alcohol  use: Yes    Comment: wine with dinner   Drug use: Never   Sexual activity: Yes  Other Topics Concern   Not on file  Social History Narrative   Not on file   Social Drivers of Health   Financial Resource Strain: Low Risk  (03/16/2024)   Overall Financial Resource Strain (CARDIA)     Difficulty of Paying Living Expenses: Not hard at all  Food Insecurity: No Food Insecurity (03/16/2024)   Hunger Vital Sign    Worried About Running Out of Food in the Last Year: Never true    Ran Out of Food in the Last Year: Never true  Transportation Needs: No Transportation Needs (03/16/2024)   PRAPARE - Administrator, Civil Service (Medical): No    Lack of Transportation (Non-Medical): No  Physical Activity: Sufficiently Active (03/16/2024)   Exercise Vital Sign    Days of Exercise per Week: 5 days    Minutes  of Exercise per Session: 150+ min  Stress: No Stress Concern Present (03/16/2024)   [employer] of Occupational Health - Occupational Stress Questionnaire    Feeling of Stress : Only a little  Social Connections: Moderately Integrated (03/16/2024)   Social Connection and Isolation Panel    Frequency of Communication with Friends and Family: Twice a week    Frequency of Social Gatherings with Friends and Family: Once a week    Attends Religious Services: More than 4 times per year    Active Member of [employer] or Organizations: No    Attends Banker Meetings: Never    Marital Status: Married  Catering Manager Violence: Not At Risk (03/16/2024)   Humiliation, Afraid, Rape, and Kick questionnaire    Fear of Current or Ex-Partner: No    Emotionally Abused: No    Physically Abused: No    Sexually Abused: No    Review of Systems   General: Negative for anorexia, weight loss, fever, chills, fatigue, weakness. ENT: Negative for  nasal congestion.see hpi. She denies post nasal drip.  CV: Negative for chest pain, angina, palpitations, dyspnea on exertion, peripheral edema.  Respiratory: Negative for dyspnea at rest, dyspnea on exertion, cough, sputum, wheezing.  GI: See history of present illness. GU:  Negative for dysuria, hematuria, urinary incontinence, urinary frequency, nocturnal urination.  Endo: Negative for unusual weight change.     Physical  Exam   BP 136/80 (BP Location: Right Arm, Patient Position: Sitting, Cuff Size: Normal)   Pulse 80   Temp 97.9 F (36.6 C) (Temporal)   Ht 5' 3 (1.6 m)   Wt 144 lb 6.4 oz (65.5 kg)   BMI 25.58 kg/m    General: Well-nourished, well-developed in no acute distress.  Eyes: No icterus. Mouth: Oropharyngeal mucosa moist and pink   Lungs: Clear to auscultation bilaterally.  Heart: Regular rate and rhythm, no murmurs rubs or gallops.  Abdomen: Bowel sounds are normal, nontender, nondistended, no hepatosplenomegaly or masses,  no abdominal bruits or hernia , no rebound or guarding.  Rectal: not performed Extremities: No lower extremity edema. No clubbing or deformities. Neuro: Alert and oriented x 4   Skin: Warm and dry, no jaundice.   Psych: Alert and cooperative, normal mood and affect.  Labs   See above Imaging Studies   No results found.  Assessment/Plan:   Assessment & Plan Esophageal Dysphagia: Dysphagia for solids, suggestive of esophageal stricture, cannot rule out esophageal dysmotility. Describes near obstructions, for which she pounds on her chest until she gets relief. ENT findings consistent with LPR with no improvement with 3 weeks of omeprazole /pepcid , and recently started on pantoprazole 40mg  BID. Upper endoscopy recommended. - Scheduled upper endoscopy with esophageal dilation with Dr. Shaaron. ASA 2.  I have discussed the risks, alternatives, benefits with regards to but not limited to the risk of reaction to medication, bleeding, infection, perforation and the patient is agreeable to proceed. Written consent to be obtained. - Continue pantoprazole BID for a few more weeks. If not helpful, then consider Voquezna.  - Advised to avoid hard/dry foods until endoscopy completed.  Laryngopharyngeal reflux Chronic throat clearing and hoarseness consistent with laryngopharyngeal reflux per ENT. She denies heartburn, could have silent reflux.  - EGD as planned - Continue  pantoprazole for a few more weeks. - Consider Voquezna if pantoprazole is ineffective.  FH colon cancer: -continue colon cancer screening every five years. She is unsure when her last colonoscopy was done. She submitted ifobt to her pcp  yesterday, result pending.                Sonny RAMAN. Ezzard, MHS, PA-C [facility]

## 2024-10-10 NOTE — Patient Instructions (Signed)
 Continue pantoprazole 40mg  twice daily before breakfast and supper. If no improvement in your symptoms after your endoscopy we will likely switch you to Voquezna.   Upper endoscopy to be scheduled in near future with Dr. Shaaron.  Please avoid hard/sticky foods until your endoscopy.   Continue colon cancer screening every five years due to your family history of colon cancer.

## 2024-10-12 ENCOUNTER — Encounter (HOSPITAL_COMMUNITY): Admission: RE | Disposition: A | Payer: Self-pay | Attending: Internal Medicine

## 2024-10-12 ENCOUNTER — Other Ambulatory Visit: Payer: Self-pay

## 2024-10-12 ENCOUNTER — Ambulatory Visit (HOSPITAL_COMMUNITY): Admitting: Anesthesiology

## 2024-10-12 ENCOUNTER — Ambulatory Visit (HOSPITAL_COMMUNITY)
Admission: RE | Admit: 2024-10-12 | Discharge: 2024-10-12 | Disposition: A | Attending: Internal Medicine | Admitting: Internal Medicine

## 2024-10-12 ENCOUNTER — Encounter (HOSPITAL_COMMUNITY): Payer: Self-pay | Admitting: Internal Medicine

## 2024-10-12 ENCOUNTER — Ambulatory Visit: Admitting: Psychiatry

## 2024-10-12 DIAGNOSIS — K449 Diaphragmatic hernia without obstruction or gangrene: Secondary | ICD-10-CM

## 2024-10-12 DIAGNOSIS — Z79899 Other long term (current) drug therapy: Secondary | ICD-10-CM | POA: Diagnosis not present

## 2024-10-12 DIAGNOSIS — K222 Esophageal obstruction: Secondary | ICD-10-CM

## 2024-10-12 DIAGNOSIS — I10 Essential (primary) hypertension: Secondary | ICD-10-CM | POA: Diagnosis not present

## 2024-10-12 DIAGNOSIS — R1310 Dysphagia, unspecified: Secondary | ICD-10-CM | POA: Diagnosis present

## 2024-10-12 DIAGNOSIS — Z800 Family history of malignant neoplasm of digestive organs: Secondary | ICD-10-CM | POA: Diagnosis not present

## 2024-10-12 HISTORY — PX: ESOPHAGEAL DILATION: SHX303

## 2024-10-12 HISTORY — PX: ESOPHAGOGASTRODUODENOSCOPY: SHX5428

## 2024-10-12 SURGERY — EGD (ESOPHAGOGASTRODUODENOSCOPY)
Anesthesia: Monitor Anesthesia Care

## 2024-10-12 MED ORDER — LIDOCAINE 2% (20 MG/ML) 5 ML SYRINGE
INTRAMUSCULAR | Status: DC | PRN
  Administered 2024-10-12: 100 mg via INTRAVENOUS

## 2024-10-12 MED ORDER — PROPOFOL 10 MG/ML IV BOLUS
INTRAVENOUS | Status: DC | PRN
  Administered 2024-10-12 (×2): 50 mg via INTRAVENOUS
  Administered 2024-10-12: 100 mg via INTRAVENOUS

## 2024-10-12 MED ORDER — LACTATED RINGERS IV SOLN: INTRAVENOUS | Status: DC

## 2024-10-12 NOTE — Anesthesia Preprocedure Evaluation (Signed)
 Anesthesia Evaluation  Patient identified by MRN, date of birth, ID band Patient awake    Reviewed: Allergy & Precautions, H&P , NPO status , Patient's Chart, lab work & pertinent test results, reviewed documented beta blocker date and time   History of Anesthesia Complications (+) PONV and history of anesthetic complications  Airway Mallampati: II  TM Distance: >3 FB Neck ROM: full    Dental no notable dental hx.    Pulmonary neg pulmonary ROS   Pulmonary exam normal breath sounds clear to auscultation       Cardiovascular Exercise Tolerance: Good hypertension, negative cardio ROS  Rhythm:regular Rate:Normal     Neuro/Psych  PSYCHIATRIC DISORDERS Anxiety Depression     Neuromuscular disease    GI/Hepatic negative GI ROS, Neg liver ROS,,,  Endo/Other  negative endocrine ROS    Renal/GU negative Renal ROS  negative genitourinary   Musculoskeletal   Abdominal   Peds  Hematology negative hematology ROS (+)   Anesthesia Other Findings   Reproductive/Obstetrics negative OB ROS                              Anesthesia Physical Anesthesia Plan  ASA: 2  Anesthesia Plan: MAC   Post-op Pain Management:    Induction:   PONV Risk Score and Plan: Propofol  infusion  Airway Management Planned:   Additional Equipment:   Intra-op Plan:   Post-operative Plan:   Informed Consent: I have reviewed the patients History and Physical, chart, labs and discussed the procedure including the risks, benefits and alternatives for the proposed anesthesia with the patient or authorized representative who has indicated his/her understanding and acceptance.     Dental Advisory Given  Plan Discussed with: CRNA  Anesthesia Plan Comments:         Anesthesia Quick Evaluation

## 2024-10-12 NOTE — Discharge Instructions (Signed)
 EGD Discharge instructions Please read the instructions outlined below and refer to this sheet in the next few weeks. These discharge instructions provide you with general information on caring for yourself after you leave the hospital. Your doctor may also give you specific instructions. While your treatment has been planned according to the most current medical practices available, unavoidable complications occasionally occur. If you have any problems or questions after discharge, please call your doctor. ACTIVITY You may resume your regular activity but move at a slower pace for the next 24 hours.  Take frequent rest periods for the next 24 hours.  Walking will help expel (get rid of) the air and reduce the bloated feeling in your abdomen.  No driving for 24 hours (because of the anesthesia (medicine) used during the test).  You may shower.  Do not sign any important legal documents or operate any machinery for 24 hours (because of the anesthesia used during the test).  NUTRITION Drink plenty of fluids.  You may resume your normal diet.  Begin with a light meal and progress to your normal diet.  Avoid alcoholic beverages for 24 hours or as instructed by your caregiver.  MEDICATIONS You may resume your normal medications unless your caregiver tells you otherwise.  WHAT YOU CAN EXPECT TODAY You may experience abdominal discomfort such as a feeling of fullness or gas pains.  FOLLOW-UP Your doctor will discuss the results of your test with you.  SEEK IMMEDIATE MEDICAL ATTENTION IF ANY OF THE FOLLOWING OCCUR: Excessive nausea (feeling sick to your stomach) and/or vomiting.  Severe abdominal pain and distention (swelling).  Trouble swallowing.  Temperature over 101 F (37.8 C).  Rectal bleeding or vomiting of blood.    Your esophagus was stretched today.  Biopsies taken.  Continue Protonix 40 mg twice daily  The recommendations to follow pending review of pathology report  Office  visit with Sonny Kerns in 3 months.

## 2024-10-12 NOTE — Interval H&P Note (Signed)
 History and Physical Interval Note:  10/12/2024 11:04 AM  Elizabeth Hull  has presented today for surgery, with the diagnosis of DYSPHAGIA, THROAT CLEARING, LPR.  The various methods of treatment have been discussed with the patient and family. After consideration of risks, benefits and other options for treatment, the patient has consented to  Procedures with comments: EGD (ESOPHAGOGASTRODUODENOSCOPY) (N/A) - 10:30am, asa 2 DILATION, ESOPHAGUS (N/A) as a surgical intervention.  The patient's history has been reviewed, patient examined, no change in status, stable for surgery.  I have reviewed the patient's chart and labs.  Questions were answered to the patient's satisfaction.     Shashank Kwasnik  Esophageal dysphagia.  EGD with esophageal dilation is feasible/appropriate today.  The risks, benefits, limitations, alternatives and imponderables have been reviewed with the patient. Potential for esophageal dilation, biopsy, etc. have also been reviewed.  Questions have been answered. All parties agreeable.

## 2024-10-12 NOTE — Transfer of Care (Signed)
 Immediate Anesthesia Transfer of Care Note  Patient: Elizabeth Hull  Procedure(s) Performed: EGD (ESOPHAGOGASTRODUODENOSCOPY) DILATION, ESOPHAGUS  Patient Location: Endoscopy Unit  Anesthesia Type:MAC  Level of Consciousness: awake, alert , oriented, and patient cooperative  Airway & Oxygen Therapy: Patient Spontanous Breathing  Post-op Assessment: Report given to RN, Post -op Vital signs reviewed and stable, and Patient moving all extremities X 4  Post vital signs: Reviewed and stable  Last Vitals:  Vitals Value Taken Time  BP 87/39 10/12/24 11:28  Temp 36.5 C 10/12/24 11:28  Pulse 67 10/12/24 11:28  Resp 14 10/12/24 11:28  SpO2 95 % 10/12/24 11:28    Last Pain:  Vitals:   10/12/24 1128  TempSrc: Oral  PainSc:       Patients Stated Pain Goal: 4 (10/12/24 0924)  Complications: No notable events documented.

## 2024-10-12 NOTE — Interval H&P Note (Signed)
 History and Physical Interval Note:  10/12/2024 10:57 AM  Elizabeth Hull  has presented today for surgery, with the diagnosis of DYSPHAGIA, THROAT CLEARING, LPR.  The various methods of treatment have been discussed with the patient and family. After consideration of risks, benefits and other options for treatment, the patient has consented to  Procedures with comments: EGD (ESOPHAGOGASTRODUODENOSCOPY) (N/A) - 10:30am, asa 2 DILATION, ESOPHAGUS (N/A) as a surgical intervention.  The patient's history has been reviewed, patient examined, no change in status, stable for surgery.  I have reviewed the patient's chart and labs.  Questions were answered to the patient's satisfaction.     Jaelene Garciagarcia  No change.  Patient is here for EGD with possible esophageal dilation as feasible appropriate for esophageal dysphagia. The risks, benefits, limitations, alternatives and imponderables have been reviewed with the patient. Potential for esophageal dilation, biopsy, etc. have also been reviewed.  Questions have been answered. All parties agreeable.

## 2024-10-13 ENCOUNTER — Encounter (HOSPITAL_COMMUNITY): Payer: Self-pay | Admitting: Internal Medicine

## 2024-10-13 LAB — SURGICAL PATHOLOGY

## 2024-10-13 NOTE — Op Note (Signed)
 [facility] Patient Name: Elizabeth Hull Procedure Date: 10/12/2024 10:44 AM MRN: [id number] Date of Birth: August 26, 1951 Attending MD: Lamar Ozell Hollingshead , MD, [id number] CSN: [id number] Age: 73 Admit Type: Outpatient Procedure:                Upper GI endoscopy Indications:              Dysphagia Providers:                Lamar Ozell Hollingshead, MD, Jon LABOR. Gerome RN, RN,                            Chad Wilson, Technician Referring MD:              Medicines:                Propofol  per Anesthesia Complications:            No immediate complications. Estimated Blood Loss:     Estimated blood loss was minimal. Procedure:                Pre-Anesthesia Assessment:                           - Prior to the procedure, a History and Physical                            was performed, and patient medications and                            allergies were reviewed. The patient's tolerance of                            previous anesthesia was also reviewed. The risks                            and benefits of the procedure and the sedation                            options and risks were discussed with the patient.                            All questions were answered, and informed consent                            was obtained. Prior Anticoagulants: The patient has                            taken no anticoagulant or antiplatelet agents. ASA                            Grade Assessment: III - A patient with severe                            systemic disease. After reviewing the risks and  benefits, the patient was deemed in satisfactory                            condition to undergo the procedure.                           After obtaining informed consent, the endoscope was                            passed under direct vision. Throughout the                            procedure, the patient's blood pressure, pulse, and                            oxygen  saturations were monitored continuously. The                            Endoscope was introduced through the mouth, and                            advanced to the second part of duodenum. The upper                            GI endoscopy was accomplished without difficulty.                            The patient tolerated the procedure well. Scope In: 11:16:16 AM Scope Out: 11:23:39 AM Total Procedure Duration: 0 hours 7 minutes 23 seconds  Findings:      A mild Schatzki ring was found at the gastroesophageal junction.      A small hiatal hernia was present.      The duodenal bulb and second portion of the duodenum were normal. The       scope was withdrawn. Dilation was performed with a Maloney dilator with       mild resistance at 54 Fr. The dilation site was examined following       endoscope reinsertion and showed no change. Estimated blood loss: none. Impression:               - Mild Schatzki ring. Dilated .                           - Small hiatal hernia.                           - Normal duodenal bulb and second portion of the                            duodenum.                           - Moderate Sedation:      Moderate (conscious) sedation was personally administered by an       anesthesia professional. The following parameters were monitored: oxygen       saturation, heart rate,  blood pressure, respiratory rate, EKG, adequacy       of pulmonary ventilation, and response to care. Recommendation:           - Patient has a contact number available for                            emergencies. The signs and symptoms of potential                            delayed complications were discussed with the                            patient. Return to normal activities tomorrow.                            Written discharge instructions were provided to the                            patient.                           - Advance diet as tolerated.                           - Continue  present medications.                           - Return to my office (date not yet determined). Procedure Code(s):        --- Professional ---                           [id number], Esophagogastroduodenoscopy, flexible,                            transoral; diagnostic, including collection of                            specimen(s) by brushing or washing, when performed                            (separate procedure)                           43450, Dilation of esophagus, by unguided sound or                            bougie, single or multiple passes Diagnosis Code(s):        --- Professional ---                           K22.2, Esophageal obstruction                           K44.9, Diaphragmatic hernia without obstruction or                            gangrene  R13.10, Dysphagia, unspecified CPT copyright 2022 American Medical Association. All rights reserved. The codes documented in this report are preliminary and upon coder review may  be revised to meet current compliance requirements. Lamar HERO. Amine Adelson, MD Lamar Ozell Hollingshead, MD 10/13/2024 12:04:34 PM This report has been signed electronically. Number of Addenda: 0

## 2024-10-20 NOTE — Anesthesia Postprocedure Evaluation (Signed)
"   Anesthesia Post Note  Patient: Elizabeth Hull  Procedure(s) Performed: EGD (ESOPHAGOGASTRODUODENOSCOPY) DILATION, ESOPHAGUS  Patient location during evaluation: Phase II Anesthesia Type: MAC Level of consciousness: awake Pain management: pain level controlled Vital Signs Assessment: post-procedure vital signs reviewed and stable Respiratory status: spontaneous breathing and respiratory function stable Cardiovascular status: blood pressure returned to baseline and stable Postop Assessment: no headache and no apparent nausea or vomiting Anesthetic complications: no Comments: Late entry   No notable events documented.   Last Vitals:  Vitals:   10/12/24 1135 10/12/24 1138  BP: (!) 97/45 (!) 102/55  Pulse: 68 73  Resp: 13 17  Temp:    SpO2: 95% 96%    Last Pain:  Vitals:   10/12/24 1138  TempSrc:   PainSc: 0-No pain                 Yvonna PARAS Atheena Spano      "

## 2024-10-23 ENCOUNTER — Telehealth: Payer: Self-pay

## 2024-10-23 ENCOUNTER — Ambulatory Visit: Payer: Self-pay | Admitting: Internal Medicine

## 2024-10-23 NOTE — Telephone Encounter (Signed)
 Pt called stating that she has finished taking the pantoprazole bid and no longer has any more refills. Pt stated that you told her that if that didn't help that something stronger could be called in. Pt states that she has not seen any improvements.

## 2024-10-24 ENCOUNTER — Other Ambulatory Visit: Payer: Self-pay

## 2024-10-24 MED ORDER — VOQUEZNA 20 MG PO TABS: 20.0000 mg | ORAL_TABLET | Freq: Every day | ORAL | Status: DC

## 2024-10-24 MED ORDER — VOQUEZNA 20 MG PO TABS: 20.0000 mg | ORAL_TABLET | Freq: Every day | ORAL | 5 refills | Status: AC

## 2024-10-24 NOTE — Progress Notes (Signed)
 Medication Samples have been provided to the patient.  Drug name: Voquezna        Strength: 20 mg        Qty: 4  LOT: [id number]  Exp.Date: 09/01/2025  Dosing instructions: Take one tablet daily  The patient has been instructed regarding the correct time, dose, and frequency of taking this medication, including desired effects and most common side effects.   Elizabeth Hull 8:22 AM 10/24/2024

## 2024-10-24 NOTE — Telephone Encounter (Signed)
 Pt was made aware and verbalized understanding. Samples have been placed up front for pick up.

## 2024-10-24 NOTE — Telephone Encounter (Signed)
 Let's try voquezna  20mg  daily. Stop pantoprazole. You can provide samples but let her know her rx was sent to [facility] and they will contact her.

## 2024-10-24 NOTE — Addendum Note (Signed)
 Addended by: EZZARD SONNY RAMAN on: 10/24/2024 05:38 AM   Modules accepted: Orders

## 2024-11-02 ENCOUNTER — Other Ambulatory Visit: Payer: Self-pay

## 2024-11-02 ENCOUNTER — Emergency Department (HOSPITAL_COMMUNITY)

## 2024-11-02 ENCOUNTER — Encounter (HOSPITAL_COMMUNITY): Payer: Self-pay

## 2024-11-02 ENCOUNTER — Emergency Department (HOSPITAL_COMMUNITY)
Admission: EM | Admit: 2024-11-02 | Discharge: 2024-11-02 | Disposition: A | Discharge status: Home / Self Care | Attending: Emergency Medicine | Admitting: Emergency Medicine

## 2024-11-02 DIAGNOSIS — R269 Unspecified abnormalities of gait and mobility: Secondary | ICD-10-CM | POA: Diagnosis present

## 2024-11-02 DIAGNOSIS — R251 Tremor, unspecified: Secondary | ICD-10-CM | POA: Diagnosis not present

## 2024-11-02 DIAGNOSIS — Z79890 Hormone replacement therapy (postmenopausal): Secondary | ICD-10-CM | POA: Insufficient documentation

## 2024-11-02 DIAGNOSIS — Z79899 Other long term (current) drug therapy: Secondary | ICD-10-CM | POA: Diagnosis not present

## 2024-11-02 DIAGNOSIS — E039 Hypothyroidism, unspecified: Secondary | ICD-10-CM | POA: Diagnosis not present

## 2024-11-02 HISTORY — DX: Gastro-esophageal reflux disease without esophagitis: K21.9

## 2024-11-02 LAB — I-STAT CHEM 8, ED
BUN: 21 mg/dL (ref 8–23)
Calcium, Ion: 1.21 mmol/L (ref 1.15–1.40)
Chloride: 98 mmol/L (ref 98–111)
Creatinine, Ser: 0.9 mg/dL (ref 0.44–1.00)
Glucose, Bld: 107 mg/dL — ABNORMAL HIGH (ref 70–99)
HCT: 36 % (ref 36.0–46.0)
Hemoglobin: 12.2 g/dL (ref 12.0–15.0)
Potassium: 4.1 mmol/L (ref 3.5–5.1)
Sodium: 137 mmol/L (ref 135–145)
TCO2: 23 mmol/L (ref 22–32)

## 2024-11-02 LAB — DIFFERENTIAL
Abs Immature Granulocytes: 0.01 K/uL (ref 0.00–0.07)
Basophils Absolute: 0 K/uL (ref 0.0–0.1)
Basophils Relative: 0 %
Eosinophils Absolute: 0 K/uL (ref 0.0–0.5)
Eosinophils Relative: 0 %
Immature Granulocytes: 0 %
Lymphocytes Relative: 9 %
Lymphs Abs: 0.7 K/uL (ref 0.7–4.0)
Monocytes Absolute: 0.5 K/uL (ref 0.1–1.0)
Monocytes Relative: 7 %
Neutro Abs: 6.4 K/uL (ref 1.7–7.7)
Neutrophils Relative %: 84 %

## 2024-11-02 LAB — COMPREHENSIVE METABOLIC PANEL WITH GFR
ALT: 19 U/L (ref 0–44)
AST: 19 U/L (ref 15–41)
Albumin: 4.7 g/dL (ref 3.5–5.0)
Alkaline Phosphatase: 68 U/L (ref 38–126)
Anion gap: 8 (ref 5–15)
BUN: 20 mg/dL (ref 8–23)
CO2: 28 mmol/L (ref 22–32)
Calcium: 9.1 mg/dL (ref 8.9–10.3)
Chloride: 98 mmol/L (ref 98–111)
Creatinine, Ser: 0.8 mg/dL (ref 0.44–1.00)
GFR, Estimated: >60 mL/min
Glucose, Bld: 102 mg/dL — ABNORMAL HIGH (ref 70–99)
Potassium: 4.2 mmol/L (ref 3.5–5.1)
Sodium: 134 mmol/L — ABNORMAL LOW (ref 135–145)
Total Bilirubin: 0.3 mg/dL (ref 0.0–1.2)
Total Protein: 7 g/dL (ref 6.5–8.1)

## 2024-11-02 LAB — TSH: TSH: 16.8 u[IU]/mL — ABNORMAL HIGH (ref 0.350–4.500)

## 2024-11-02 LAB — CBC
HCT: 35.6 % — ABNORMAL LOW (ref 36.0–46.0)
Hemoglobin: 12 g/dL (ref 12.0–15.0)
MCH: 30.3 pg (ref 26.0–34.0)
MCHC: 33.7 g/dL (ref 30.0–36.0)
MCV: 89.9 fL (ref 80.0–100.0)
Platelets: 254 K/uL (ref 150–400)
RBC: 3.96 MIL/uL (ref 3.87–5.11)
RDW: 13.5 % (ref 11.5–15.5)
WBC: 7.7 K/uL (ref 4.0–10.5)
nRBC: 0 % (ref 0.0–0.2)

## 2024-11-02 LAB — T4, FREE: Free T4: 1.33 ng/dL (ref 0.80–2.00)

## 2024-11-02 LAB — ETHANOL: Alcohol, Ethyl (B): <15 mg/dL

## 2024-11-02 LAB — CBG MONITORING, ED: Glucose-Capillary: 121 mg/dL — ABNORMAL HIGH (ref 70–99)

## 2024-11-02 MED ORDER — DIAZEPAM 5 MG/ML IJ SOLN
2.5000 mg | Freq: Once | INTRAMUSCULAR | Status: AC
  Administered 2024-11-02: 2.5 mg via INTRAVENOUS
  Filled 2024-11-02: qty 2

## 2024-11-02 MED ORDER — LEVOTHYROXINE SODIUM 100 MCG PO TABS: 100.0000 ug | ORAL_TABLET | Freq: Every day | ORAL | 0 refills | Status: AC

## 2024-11-02 NOTE — Discharge Instructions (Signed)
 You were seen for your tremor in the emergency department. Your TSH was found to be very elevated (16) which can be reflective of hypothyroidism.   At home, please take 100 mcg of levothyroxine  daily. If your symptoms worsen go back to the 88 mcg daily  Check your MyChart online for the results of any tests that had not resulted by the time you left the emergency department.   Follow-up with your primary doctor in 2-3 days regarding your visit.  Follow-up with neurology  Return immediately to the emergency department if you experience any of the following: worsening tremor, weakness, falls, or any other concerning symptoms.    Thank you for visiting our Emergency Department. It was a pleasure taking care of you today.

## 2024-11-02 NOTE — ED Triage Notes (Signed)
 Pt arrived via POV from home c/o nausea, and an unsteady gait that has been on-going since Monday. Pt reports symptoms began while shopping at the grocery store, and reports on-going shakiness and nausea w/o emesis. Pt verbalizes concern for possible mini-stroke. Pt denies any falls, denies other symptoms. EDP at bedside during Triage.

## 2024-11-02 NOTE — ED Provider Notes (Signed)
 " [place] EMERGENCY DEPARTMENT AT [facility] Provider Note   CSN: [id number] Arrival date & time: 11/02/24  1022     Patient presents with: Gait Problem   Elizabeth Hull is a 74 y.o. female.  {Add pertinent medical, surgical, social history, OB history to HPI:5180} 74 year old female history of generalized anxiety disorder, hypothyroidism on levothyroxine , and GERD who presents to the emergency department with tremors, difficulty walking, and nausea.  Patient reports that since Monday she has been having tremors that started in her hands and have progressed to the rest of her body.  She is now having to hold onto walk.  Says that she does feel off balance but denies a vertiginous sensation.  No headache.  Nausea but no vomiting.  I was worried that she may have had a stroke.  Of note, was also started on Voquenza 2 weeks ago.  No other medication changes.  No alcohol  use.  No heavy caffeine use       Prior to Admission medications  Medication Sig Start Date End Date Taking? Authorizing Provider  buPROPion  (WELLBUTRIN  XL) 300 MG 24 hr tablet TAKE 1 TABLET(300 MG) BY MOUTH DAILY 08/17/24   Eappen, Saramma, MD  busPIRone  (BUSPAR ) 15 MG tablet TAKE 2 TABLETS BY MOUTH EVERY MORNING AND 1 TABLET DAILY EVERY EVENING. 09/03/24   Eappen, Saramma, MD  citalopram  (CELEXA ) 20 MG tablet Take 1 tablet (20 mg total) by mouth daily. Dose change 05/30/24   Eappen, Saramma, MD  gabapentin  (NEURONTIN ) 600 MG tablet Take 600 mg by mouth 2 (two) times daily. 11/10/17   [provider]  hydrOXYzine  (ATARAX ) 25 MG tablet Take 1 tablet (25 mg total) by mouth 2 (two) times daily as needed for anxiety. 06/12/24   Eappen, Saramma, MD  KRILL OIL PO Take 1,200 mg by mouth daily.    [provider]  levothyroxine  (SYNTHROID ) 88 MCG tablet Take 88 mcg by mouth daily. 02/12/22   [provider]  Multiple Vitamin (MULTI-VITAMIN DAILY PO) Take by mouth.    [provider]   oxybutynin (DITROPAN-XL) 5 MG 24 hr tablet Take 5 mg by mouth daily. 07/28/23   [provider]  rosuvastatin  (CRESTOR ) 5 MG tablet Take 5 mg by mouth at bedtime. 08/31/18   [provider]  traZODone  (DESYREL ) 50 MG tablet Take 1 tablet (50 mg total) by mouth at bedtime. 12/06/23   Eappen, Saramma, MD  Vonoprazan Fumarate  (VOQUEZNA ) 20 MG TABS Take 20 mg by mouth daily. 10/24/24   Ezzard Sonny RAMAN, PA-C  Vonoprazan Fumarate  (VOQUEZNA ) 20 MG TABS Take 20 mg by mouth daily. 10/24/24   Ezzard Sonny RAMAN, PA-C    Allergies: Hydrocodone, Nucynta [tapentadol hcl], Nucynta [tapentadol], Other, Sulfa antibiotics, and Codeine    Review of Systems  Updated Vital Signs BP (!) 180/81   Pulse 91   Temp 98.8 F (37.1 C) (Oral)   Resp 18   Ht 5' 3 (1.6 m)   Wt 65.5 kg   SpO2 97%   BMI 25.58 kg/m   Physical Exam Vitals and nursing note reviewed.  Constitutional:      General: She is not in acute distress.    Appearance: She is well-developed.  HENT:     Head: Normocephalic and atraumatic.     Right Ear: External ear normal.     Left Ear: External ear normal.     Nose: Nose normal.  Eyes:     Extraocular Movements: Extraocular movements intact.  Conjunctiva/sclera: Conjunctivae normal.     Pupils: Pupils are equal, round, and reactive to light.     Comments: Fasting nystagmus to the left  Cardiovascular:     Rate and Rhythm: Normal rate and regular rhythm.     Heart sounds: No murmur heard. Pulmonary:     Effort: Pulmonary effort is normal. No respiratory distress.     Breath sounds: Normal breath sounds.  Musculoskeletal:     Cervical back: Normal range of motion and neck supple.     Right lower leg: No edema.     Left lower leg: No edema.  Skin:    General: Skin is warm and dry.  Neurological:     Mental Status: She is alert.     Comments: NIHSS Exam  Level of Consciousness: Alert  LOC Questions: Answers Month and Age Correctly  LOC Commands: Opens and  Closes Eyes and Hands on command  Best Gaze: Horizontal ocular movements intact with fast beat nystagmus to the left Visual Fields: No visual field loss  Facial Palsy: None  L Upper Extremity Motor: No drift after 10 seconds  R Upper Extremity Motor: No drift after 10 seconds  L Lower extremity Motor: No drift after 5 seconds  R Lower extremity Motor: No drift after 5 seconds  Ataxia: Absent  Sensory: Intact sensation to light touch on face, arms, trunk, and legs bilaterally  Best Language: No aphasia  Dysarthria: No dysarthria  Neglect: No visual or sensory neglect  Slow shuffling gait  Psychiatric:        Mood and Affect: Mood normal.     (all labs ordered are listed, but only abnormal results are displayed) Labs Reviewed - No data to display  EKG: None  Radiology: No results found.  {Document cardiac monitor, telemetry assessment procedure when appropriate:32947} Procedures   Medications Ordered in the ED - No data to display    {Click here for ABCD2, HEART and other calculators REFRESH Note before signing:1}                              Medical Decision Making Amount and/or Complexity of Data Reviewed Labs: ordered. Radiology: ordered.  Risk Prescription drug management.   ***  {Document critical care time when appropriate  Document review of labs and clinical decision tools ie CHADS2VASC2, etc  Document your independent review of radiology images and any outside records  Document your discussion with family members, caretakers and with consultants  Document social determinants of health affecting pt's care  Document your decision making why or why not admission, treatments were needed:32947:::1}   Final diagnoses:  None    ED Discharge Orders     None        "

## 2024-11-02 NOTE — ED Notes (Signed)
 Patient transported to MRI

## 2024-11-03 ENCOUNTER — Ambulatory Visit: Admitting: Professional Counselor

## 2024-11-03 DIAGNOSIS — F411 Generalized anxiety disorder: Secondary | ICD-10-CM

## 2024-11-03 NOTE — Progress Notes (Signed)
 " THERAPIST PROGRESS NOTE  Virtual Visit via Video Note  I connected with Elizabeth Hull on 11/03/2024 at 11:00 AM EST by a video enabled telemedicine application and verified that I am speaking with the correct person using two identifiers.  Location: Patient: Home Provider: Remote office   I discussed the limitations of evaluation and management by telemedicine and the availability of in person appointments. The patient expressed understanding and agreed to proceed.   I discussed the assessment and treatment plan with the patient. The patient was provided an opportunity to ask questions and all were answered. The patient agreed with the plan and demonstrated an understanding of the instructions.   The patient was advised to call back or seek an in-person evaluation if the symptoms worsen or if the condition fails to improve as anticipated.  I provided 54 minutes of non-face-to-face time during this encounter. Elizabeth Hull, [facility]  Session Time: 11:00 AM - 11:54 AM   Participation Level: Active  Behavioral Response: Casual, Confused, Anxious  Type of Therapy: Individual Therapy  Treatment Goals addressed: Active Anxiety  LTG: I would like to be able to understand why I have such low esteem. I've always had low esteem and still do. I don't think I'm as good as someone else. I would love to learn how to be less judgmental of people. To be able to handle my anxiety.  (Progressing)    Start:  04/28/24    Expected End:  04/27/25    Goal Note Reviewed 11/03/24 - I think I'm doing okay on all of them, not great on some of them, but better.   STG: To reduce sxs of anxiety AEB reduction in GAD7 scores and use of coping mechanisms over the next 90 days. (Progressing)  Goal Note Reviewed 11/03/24 - I don't even know what a coping mechanism is. I know I've got them written down somewhere.  STG: To improve self-esteem AEB identifying and restructuring negative core beliefs about self over  the next 90 days.  (Progressing)  Goal Note Reviewed 11/03/24 - I'm doing so-so. I look at myself in the mirror when I'm going to church and say, well you don't look too bad.   STG: To improve relationship satisfaction AEB reduction in judgmentalness and engaging in intimacy building exercises over the next 90 days.  (Progressing)  Goal Note Reviewed 11/03/24 - Needs improvement  ProgressTowards Goals: Progressing  Interventions: CBT, Motivational Interviewing, and Supportive  Summary: Elizabeth Hull is a 74 y.o. female who presents with a history of anxiety and depression. She appeared alert, although confused at times, but oriented x5. Elizabeth Hull shared about the recent holidays. She also discussed recent health issues, citing her thyroid  being off as contributing to her current state of confusion, along with some physical symptoms. She is working with her PCP to address these concerns. She shared concerns about her son and his marriage, noting her daughter-in-law went overseas to her home country and hasn't returned. Elizabeth Hull noted progress on goals and areas for continued improvement.  Therapist Response: Conducted session with Elizabeth Hull. Began session with check-in/update since previous session. Utilized empathetic and reflective listening. Used open-ended questions to facilitate discussion and summarized Elizabeth Hull's thoughts/feelings. Reviewed treatment plan with input from Elizabeth Hull on current strengths, needs, and progress towards goals. Scheduled additional appointment and concluded session.   Suicidal/Homicidal: No  Plan: Return again in 8 weeks.  Diagnosis: GAD (generalized anxiety disorder)  Collaboration of Care: Medication Management AEB chart review  Patient/Guardian was advised Release of  Information must be obtained prior to any record release in order to collaborate their care with an outside provider. Patient/Guardian was advised if they have not already done so to contact the registration department to  sign all necessary forms in order for us  to release information regarding their care.   Consent: Patient/Guardian gives verbal consent for treatment and assignment of benefits for services provided during this visit. Patient/Guardian expressed understanding and agreed to proceed.   Elizabeth Hull, [facility] 11/03/2024  "

## 2024-11-16 ENCOUNTER — Ambulatory Visit: Admitting: Psychiatry

## 2024-11-16 ENCOUNTER — Encounter: Payer: Self-pay | Admitting: Psychiatry

## 2024-11-16 VITALS — BP 131/80 | HR 83 | Temp 97.4°F | Ht 63.0 in | Wt 144.6 lb

## 2024-11-16 DIAGNOSIS — F411 Generalized anxiety disorder: Secondary | ICD-10-CM | POA: Diagnosis not present

## 2024-11-16 DIAGNOSIS — F3342 Major depressive disorder, recurrent, in full remission: Secondary | ICD-10-CM | POA: Diagnosis not present

## 2024-11-16 MED ORDER — TRAZODONE HCL 50 MG PO TABS: 50.0000 mg | ORAL_TABLET | Freq: Every day | ORAL | 1 refills | Status: AC

## 2024-11-16 MED ORDER — CITALOPRAM HYDROBROMIDE 20 MG PO TABS: 20.0000 mg | ORAL_TABLET | Freq: Every day | ORAL | 1 refills | Status: AC

## 2024-11-16 NOTE — Progress Notes (Signed)
 [facility] MD OP Progress Note  11/16/2024 9:35 AM Elizabeth Hull  MRN:  [id number]  Chief Complaint:  Chief Complaint  Patient presents with   Follow-up   Anxiety   Depression   Medication Refill   Discussed the use of AI scribe software for clinical note transcription with the patient, who gave verbal consent to proceed.  History of Present Illness Elizabeth Hull is a 74 year old Caucasian female, married, homemaker, has a history of GAD, MDD, lives in [place] who presented for a follow-up appointment.  Ongoing stress related to family issues, including her son's marital separation and anger management difficulties, has caused her significant worry and concern for his well-being. She explains that these events, along with illness in the family over the holidays, led to disappointment and disrupted her plans to visit relatives, which contributed to her emotional distress.  Following the holidays, she describes experiencing anxiety. While shopping, she experienced an episode where her hands began twitching, which progressed to shaking throughout her body and slurred speech. She did not seek medical attention immediately, but her symptoms persisted until her friend advised her to go to the emergency room. After her thyroid  medication was adjusted, she notes that the episode resolved, though she remains uncertain as to the cause. She notes that she has access to hydroxyzine  as needed for anxiety, which she used after returning from the emergency room.  Her current medications include citalopram  (Celexa ) 20 mg daily for depression, Wellbutrin , Buspar , and she recently started vonoprazan (Voquezna ) for silent reflux. She expresses no concerns about her sleep.  She denies any suicidal thoughts or thoughts of harming others.  She has hypothyroidism with recent thyroid  abnormalities noted at her most recent emergency department visit.  She agrees to follow-up with her provider for the same. She is scheduled for  right reverse shoulder replacement.   Visit Diagnosis:    ICD-10-CM   1. GAD (generalized anxiety disorder)  F41.1 citalopram  (CELEXA ) 20 MG tablet    traZODone  (DESYREL ) 50 MG tablet    2. MDD (major depressive disorder), recurrent, in full remission  F33.42       Past Psychiatric History: I have reviewed past psychiatric history from progress note on 04/03/2022.  Past trials of Elavil , Xanax, BuSpar   Past Medical History:  Past Medical History:  Diagnosis Date   Anxiety    Arthritis    Cervical spinal stenosis    Complication of anesthesia    Depression    GERD (gastroesophageal reflux disease)    HNP (herniated nucleus pulposus), cervical    PONV (postoperative nausea and vomiting)    Thyroid  condition     Past Surgical History:  Procedure Laterality Date   ANTERIOR CERVICAL DECOMP/DISCECTOMY FUSION N/A 09/16/2018   Procedure: C4-C5 ANTERIOR CERVICAL DECOMPRESSION/DISCECTOMY FUSION, ALLOGRAFT, PLATE;  Surgeon: Barbarann Oneil BROCKS, MD;  Location: [facility] OR;  Service: Orthopedics;  Laterality: N/A;   BACK SURGERY  2017   lumbar   BRAIN TUMOR EXCISION  2009   BREAST CYST ASPIRATION     CARPAL TUNNEL RELEASE Bilateral    ESOPHAGEAL DILATION N/A 10/12/2024   Procedure: DILATION, ESOPHAGUS;  Surgeon: Shaaron Lamar HERO, MD;  Location: [facility];  Service: Endoscopy;  Laterality: N/A;   ESOPHAGOGASTRODUODENOSCOPY N/A 10/12/2024   Procedure: EGD (ESOPHAGOGASTRODUODENOSCOPY);  Surgeon: Shaaron Lamar HERO, MD;  Location: [facility];  Service: Endoscopy;  Laterality: N/A;  10:30am, asa 2   FOOT SURGERY Bilateral    high arch, bump on top of foot   TUBAL LIGATION  Family Psychiatric History: I have reviewed family psychiatric history from progress note on 04/03/2022.  Family History:  Family History  Problem Relation Age of Onset   Colon cancer Mother        age 79, died from copd complications   Depression Brother    Anxiety disorder Brother    Depression Son    Anxiety disorder  Son    Bipolar disorder Granddaughter    Esophageal cancer Neg Hx     Social History: I have reviewed social history from progress note on 04/03/2022. Social History   Socioeconomic History   Marital status: Married    Spouse name: Not on file   Number of children: 2   Years of education: Not on file   Highest education level: High school graduate  Occupational History   Not on file  Tobacco Use   Smoking status: Never   Smokeless tobacco: Never  Vaping Use   Vaping status: Never Used  Substance and Sexual Activity   Alcohol  use: Yes    Comment: wine with dinner   Drug use: Never   Sexual activity: Yes  Other Topics Concern   Not on file  Social History Narrative   Not on file   Social Drivers of Health   Tobacco Use: Low Risk (11/16/2024)   Patient History    Smoking Tobacco Use: Never    Smokeless Tobacco Use: Never    Passive Exposure: Not on file  Financial Resource Strain: Low Risk (03/16/2024)   Overall Financial Resource Strain (CARDIA)    Difficulty of Paying Living Expenses: Not hard at all  Food Insecurity: No Food Insecurity (03/16/2024)   Hunger Vital Sign    Worried About Running Out of Food in the Last Year: Never true    Ran Out of Food in the Last Year: Never true  Transportation Needs: No Transportation Needs (03/16/2024)   PRAPARE - Administrator, Civil Service (Medical): No    Lack of Transportation (Non-Medical): No  Physical Activity: Sufficiently Active (03/16/2024)   Exercise Vital Sign    Days of Exercise per Week: 5 days    Minutes of Exercise per Session: 150+ min  Stress: No Stress Concern Present (03/16/2024)   [employer] of Occupational Health - Occupational Stress Questionnaire    Feeling of Stress : Only a little  Social Connections: Moderately Integrated (03/16/2024)   Social Connection and Isolation Panel    Frequency of Communication with Friends and Family: Twice a week    Frequency of Social Gatherings with  Friends and Family: Once a week    Attends Religious Services: More than 4 times per year    Active Member of Clubs or Organizations: No    Attends Banker Meetings: Never    Marital Status: Married  Depression (PHQ2-9): Low Risk (11/16/2024)   Depression (PHQ2-9)    PHQ-2 Score: 0  Alcohol  Screen: Low Risk (03/16/2024)   Alcohol  Screen    Last Alcohol  Screening Score (AUDIT): 2  Housing: Low Risk (03/16/2024)   Housing Stability Vital Sign    Unable to Pay for Housing in the Last Year: No    Number of Times Moved in the Last Year: 0    Homeless in the Last Year: No  Utilities: Not At Risk (03/16/2024)   AHC Utilities    Threatened with loss of utilities: No  Health Literacy: Adequate Health Literacy (03/16/2024)   B1300 Health Literacy    Frequency of need for help  with medical instructions: Rarely    Allergies: Allergies[1]  Metabolic Disorder Labs: No results found for: HGBA1C, MPG No results found for: PROLACTIN No results found for: CHOL, TRIG, HDL, CHOLHDL, VLDL, LDLCALC Lab Results  Component Value Date   TSH 16.800 (H) 11/02/2024    Therapeutic Level Labs: No results found for: LITHIUM No results found for: VALPROATE No results found for: CBMZ  Current Medications: Current Outpatient Medications  Medication Sig Dispense Refill   VEVYE 0.1 % SOLN Apply 1 drop to eye 2 (two) times daily.     buPROPion  (WELLBUTRIN  XL) 300 MG 24 hr tablet TAKE 1 TABLET(300 MG) BY MOUTH DAILY 90 tablet 1   busPIRone  (BUSPAR ) 15 MG tablet TAKE 2 TABLETS BY MOUTH EVERY MORNING AND 1 TABLET DAILY EVERY EVENING. 270 tablet 1   citalopram  (CELEXA ) 20 MG tablet Take 1 tablet (20 mg total) by mouth daily. Dose change 90 tablet 1   gabapentin  (NEURONTIN ) 600 MG tablet Take 600 mg by mouth 2 (two) times daily.     hydrOXYzine  (ATARAX ) 25 MG tablet Take 1 tablet (25 mg total) by mouth 2 (two) times daily as needed for anxiety. 60 tablet 0   KRILL OIL PO Take  1,200 mg by mouth daily.     levothyroxine  (SYNTHROID ) 100 MCG tablet Take 1 tablet (100 mcg total) by mouth daily. 30 tablet 0   Multiple Vitamin (MULTI-VITAMIN DAILY PO) Take by mouth.     oxybutynin (DITROPAN-XL) 5 MG 24 hr tablet Take 5 mg by mouth daily.     rosuvastatin  (CRESTOR ) 5 MG tablet Take 5 mg by mouth at bedtime.  3   traZODone  (DESYREL ) 50 MG tablet Take 1 tablet (50 mg total) by mouth at bedtime. 90 tablet 1   Vonoprazan Fumarate  (VOQUEZNA ) 20 MG TABS Take 20 mg by mouth daily. 30 tablet 5   No current facility-administered medications for this visit.     Musculoskeletal: Strength & Muscle Tone: within normal limits Gait & Station: normal Patient leans: N/A  Psychiatric Specialty Exam: Review of Systems  Psychiatric/Behavioral:  The patient is nervous/anxious.     Blood pressure 131/80, pulse 83, temperature (!) 97.4 F (36.3 C), temperature source Temporal, height 5' 3 (1.6 m), weight 144 lb 9.6 oz (65.6 kg).Body mass index is 25.61 kg/m.  General Appearance: Casual  Eye Contact:  Fair  Speech:  Clear and Coherent  Volume:  Normal  Mood:  Anxious  Affect:  Appropriate  Thought Process:  Goal Directed and Descriptions of Associations: Intact  Orientation:  Full (Time, Place, and Person)  Thought Content: Logical   Suicidal Thoughts:  No  Homicidal Thoughts:  No  Memory:  Immediate;   Fair Recent;   Fair Remote;   Fair  Judgement:  Fair  Insight:  Fair  Psychomotor Activity:  Normal  Concentration:  Concentration: Fair and Attention Span: Good  Recall:  [employer] of Knowledge: Fair  Language: Fair  Akathisia:  No  Handed:  Right  AIMS (if indicated): not done  Assets:  Communication Skills Desire for Improvement Housing Social Support Talents/Skills Transportation  ADL's:  Intact  Cognition: WNL  Sleep:  Fair   Screenings: Midwife Visit from 04/03/2022 in [facility]  AIMS Total  Score 0   GAD-7    Flowsheet Row Office Visit from 11/16/2024 in [facility] Counselor from 03/16/2024 in [facility] Office Visit  from 05/25/2023 in [facility] Office Visit from 01/21/2023 in [facility] Office Visit from 12/11/2022 in [facility]  Total GAD-7 Score 2 7 0 2 0   PHQ2-9    Flowsheet Row Office Visit from 11/16/2024 in [facility] Counselor from 03/16/2024 in [facility] Video Visit from 03/07/2024 in [facility] Office Visit from 05/25/2023 in [facility] Office Visit from 01/21/2023 in [facility]  PHQ-2 Total Score 0 0 0 0 0  PHQ-9 Total Score -- 6 -- 0 3   Flowsheet Row Office Visit from 11/16/2024 in [facility] ED from 11/02/2024 in [facility] Emergency Department at [facility] Admission (Discharged) from 10/12/2024 in [facility]  C-SSRS RISK CATEGORY Moderate Risk No Risk No Risk     Assessment and Plan: Elizabeth Hull is a 74 year old Caucasian female who presented for a follow-up appointment, discussed assessment and plan as noted below.  1. GAD (generalized anxiety disorder)-improving Although had recent worsening of anxiety due to situational stressors currently managing well.  Motivated to follow-up with therapist. Aware of drug to drug interaction between Celexa , new medication which was added recently Vonoprazan.  Celexa  dosage currently at 20 mg.  Patient to monitor closely for any adverse side effects and will consider further dosage reduction in the future.  Most recent EKG reviewed which was done at the recent visit  to the emergency department on 11/02/2024-sinus rhythm QTc 458. Continue Celexa  20 mg daily Continue BuSpar  30 mg in the morning and 15 mg in the evening Continue Hydroxyzine  12.5-25 mg 3 times a day as needed Continue psychotherapy sessions with Ms. Almarie Ligas  2. MDD (major depressive disorder), recurrent, in full remission Currently denies any significant depression symptoms. Continue current medication regimen including Celexa , BuSpar . Continue Wellbutrin  XL 300 mg daily Continue Trazodone  50 mg daily at bedtime Continue CBT   Follow-up Follow-up in clinic in 2 months or sooner if needed.   Collaboration of Care: Collaboration of Care: Other most recent labs reviewed and discussed-TSH dated 11/02/2024-elevated patient to follow-up with primary care provider for management of the same CMP shows sodium-134-Will recommend repeating this..  Reviewed notes per ED physician Dr. Lamar Gander dated 11/02/2024-ambulatory referral placed to neurology.  Patient/Guardian was advised Release of Information must be obtained prior to any record release in order to collaborate their care with an outside provider. Patient/Guardian was advised if they have not already done so to contact the registration department to sign all necessary forms in order for us  to release information regarding their care.   Consent: Patient/Guardian gives verbal consent for treatment and assignment of benefits for services provided during this visit. Patient/Guardian expressed understanding and agreed to proceed.   This note was generated in part or whole with voice recognition software. Voice recognition is usually quite accurate but there are transcription errors that can and very often do occur. I apologize for any typographical errors that were not detected and corrected.    Deloise Marchant, MD 11/17/2024, 10:18 AM     [1]  Allergies Allergen Reactions   Hydrocodone Other (See Comments)    Unknown   Nucynta  [Tapentadol Hcl] Other (See Comments)    Unknown   Nucynta [Tapentadol]    Other    Sulfa Antibiotics Nausea Only   Codeine  Nausea Only and Nausea And Vomiting

## 2024-11-16 NOTE — Patient Instructions (Signed)
Serotonin Syndrome Serotonin is a chemical that helps to control several functions in the body. This chemical is also called a neurotransmitter. It controls: Brain and nerve cell function. Mood and emotions. Memory. Eating. Sleeping. Sexual activity. Stress response. Having too much serotonin in your body can cause serotonin syndrome. This condition can be harmful to your brain and nerve cells. This can be a life-threatening condition. What are the causes? This condition may be caused by taking medicines or drugs that increase the level of serotonin in your body, such as: Antidepressant medicines. Migraine medicines. Certain pain medicines. Certain drugs, including ecstasy, LSD, cocaine, and amphetamines. Over-the-counter cough or cold medicines that contain dextromethorphan. Certain herbal supplements, including St. John's wort, ginseng, and nutmeg. This condition usually occurs when you take these medicines or drugs together, but it can also happen with a high dose of a single medicine or drug. What increases the risk? You are more likely to develop this condition if: You just started taking a medicine or drug that increases the level of serotonin in the body. You recently increased the dose of a medicine or drug that increases the level of serotonin in the body. You take more than one medicine or drug that increases the level of serotonin in the body. What are the signs or symptoms? Symptoms of this condition usually start within several hours of taking a medicine or drug. Symptoms may be mild or severe. Mild symptoms include: Sweating. Restlessness or agitation. Muscle twitching or stiffness. Rapid heart rate. Nausea, vomiting, or diarrhea. Shivering or goose bumps. Confusion. Severe symptoms include: Irregular heartbeat. Seizures. Loss of consciousness. High fever. How is this diagnosed? This condition may be diagnosed based on: Your medical history. A physical  exam. Your prior use of drugs and medicines. Blood or urine tests. These may be used to rule out other causes of your symptoms. How is this treated? The treatment for this condition depends on the severity of your symptoms. For mild cases, stopping the medicine or drug that caused your condition is usually all that is needed. For moderate to severe cases, treatment in a hospital may be needed to prevent or treat life-threatening symptoms. Treatment may include: Medicines to control your symptoms. IV fluids. Actions to support your breathing. Treatments to control your body temperature. Follow these instructions at home: Medicines  Take over-the-counter and prescription medicines only as told by your health care provider. Check with your health care provider before you start taking any new prescriptions, over-the-counter medicines, herbs, or supplements. Do not combine any medicines that can cause this condition. Lifestyle  Maintain a healthy lifestyle. Eat a healthy diet that includes plenty of vegetables, fruits, whole grains, low-fat dairy products, and lean protein. Do not eat a lot of foods that are high in fat, added sugars, or salt. Get the right amount and quality of sleep. Most adults need 7-9 hours of sleep each night. Make time to exercise, even if it is only for short periods of time. Most adults should exercise for at least 150 minutes each week. Do not drink alcohol. Do not use illegal drugs. Do not take medicines for reasons other than they are prescribed. General instructions Do not use any products that contain nicotine or tobacco. These products include cigarettes, chewing tobacco, and vaping devices, such as e-cigarettes. If you need help quitting, ask your health care provider. Contact a health care provider if: Your symptoms do not improve or they get worse. Get help right away if: You have worsening  confusion, severe headache, chest pain, high fever, seizures, or  loss of consciousness. You experience serious side effects of medicine, such as swelling of your face, lips, tongue, or throat. These symptoms may be an emergency. Get help right away. Call 911. Do not wait to see if the symptoms will go away. Do not drive yourself to the hospital. Also, get help right away if: You have serious thoughts about hurting yourself or others. Take one of these steps if you feel like you may hurt yourself or others, or have thoughts about taking your own life: Go to your nearest emergency room. Call 911. Call the National Suicide Prevention Lifeline at 276-283-0524 or 988. This is open 24 hours a day. Text the Crisis Text Line at 337-730-6713. Summary Serotonin is a chemical that helps to control several functions in the body. High levels of serotonin in the body can cause serotonin syndrome, which can be life-threatening. This condition may be caused by taking medicines or drugs that increase the level of serotonin in your body. Treatment depends on the severity of your symptoms. For mild cases, stopping the medicine or drug that caused your condition is usually all that is needed. Check with your health care provider before you start taking any new prescriptions, over-the-counter medicines, herbs, or supplements. This information is not intended to replace advice given to you by your health care provider. Make sure you discuss any questions you have with your health care provider. Document Revised: 01/08/2022 Document Reviewed: 01/08/2022 Elsevier Patient Education  2024 [employer].

## 2024-11-20 ENCOUNTER — Telehealth: Payer: Self-pay

## 2024-11-20 ENCOUNTER — Telehealth: Payer: Self-pay | Admitting: Psychiatry

## 2024-11-20 NOTE — Telephone Encounter (Signed)
 Please let her know I agree with 10 mg if she is tolerating the dosage well. If still has tremors or other side effects she could reduce further to 5 mg or hold it and get immediate help.

## 2024-11-20 NOTE — Telephone Encounter (Signed)
 Patient confirmed and verbalized understanding. Patient wanted to let Dr.Eappen know she has a PCP apt today as well.

## 2024-11-20 NOTE — Telephone Encounter (Signed)
 Patient Elizabeth Hull Left voicemail asking for a call back. Spoke with patient for clarification. Patient reports she is taking 10 mg of citalopram  by cutting her 20 mg tablet in half. She decreased the dose a couple of days ago because 20 mg worsened her tremors.She states she is doing well on 10 mg but was unsure if this was the right thing to do. She would like me to let Dr.Eappen know.

## 2024-11-20 NOTE — Telephone Encounter (Signed)
 Responded to CMA in another phone note

## 2024-11-30 ENCOUNTER — Ambulatory Visit: Admitting: Psychiatry

## 2024-12-22 ENCOUNTER — Ambulatory Visit: Admitting: Internal Medicine

## 2025-01-01 ENCOUNTER — Ambulatory Visit: Admitting: Professional Counselor

## 2025-01-17 ENCOUNTER — Telehealth: Admitting: Psychiatry
